# Patient Record
Sex: Male | Born: 1960
Health system: Southern US, Community
[De-identification: ages and names within clinical notes are randomized; demographics above are authoritative.]

## PROBLEM LIST (undated history)

## (undated) DIAGNOSIS — I2510 Atherosclerotic heart disease of native coronary artery without angina pectoris: Secondary | ICD-10-CM

## (undated) DIAGNOSIS — S46212A Strain of muscle, fascia and tendon of other parts of biceps, left arm, initial encounter: Secondary | ICD-10-CM

## (undated) DIAGNOSIS — E119 Type 2 diabetes mellitus without complications: Secondary | ICD-10-CM

## (undated) DIAGNOSIS — E669 Obesity, unspecified: Secondary | ICD-10-CM

## (undated) DIAGNOSIS — D649 Anemia, unspecified: Secondary | ICD-10-CM

## (undated) DIAGNOSIS — I10 Essential (primary) hypertension: Secondary | ICD-10-CM

## (undated) DIAGNOSIS — G4730 Sleep apnea, unspecified: Secondary | ICD-10-CM

## (undated) DIAGNOSIS — R7302 Impaired glucose tolerance (oral): Secondary | ICD-10-CM

## (undated) DIAGNOSIS — R7303 Prediabetes: Secondary | ICD-10-CM

## (undated) DIAGNOSIS — Z87442 Personal history of urinary calculi: Secondary | ICD-10-CM

## (undated) DIAGNOSIS — E785 Hyperlipidemia, unspecified: Secondary | ICD-10-CM

## (undated) DIAGNOSIS — Z9889 Other specified postprocedural states: Secondary | ICD-10-CM

## (undated) HISTORY — DX: Essential (primary) hypertension: I10

## (undated) HISTORY — DX: Prediabetes: R73.03

## (undated) HISTORY — DX: Atherosclerotic heart disease of native coronary artery without angina pectoris: I25.10

## (undated) HISTORY — DX: Type 2 diabetes mellitus without complications: E11.9

## (undated) HISTORY — DX: Impaired glucose tolerance (oral): R73.02

## (undated) HISTORY — PX: BACK SURGERY: SHX140

## (undated) HISTORY — DX: Other specified postprocedural states: Z98.890

## (undated) HISTORY — PX: EYE SURGERY: SHX253

## (undated) HISTORY — PX: HEMORRHOID SURGERY: SHX153

## (undated) HISTORY — DX: Hyperlipidemia, unspecified: E78.5

## (undated) HISTORY — DX: Obesity, unspecified: E66.9

---

## 1998-03-03 ENCOUNTER — Ambulatory Visit: Admission: RE | Admit: 1998-03-03 | Discharge: 1998-03-03 | Payer: Self-pay | Admitting: Otolaryngology

## 1998-11-29 ENCOUNTER — Ambulatory Visit (HOSPITAL_COMMUNITY): Admission: RE | Admit: 1998-11-29 | Discharge: 1998-11-29 | Payer: Self-pay | Admitting: *Deleted

## 2005-04-25 ENCOUNTER — Emergency Department (HOSPITAL_COMMUNITY): Admission: EM | Admit: 2005-04-25 | Discharge: 2005-04-26 | Payer: Self-pay | Admitting: Emergency Medicine

## 2010-06-28 ENCOUNTER — Other Ambulatory Visit: Payer: Self-pay | Admitting: Cardiology

## 2010-06-28 ENCOUNTER — Inpatient Hospital Stay (HOSPITAL_COMMUNITY)
Admission: EM | Admit: 2010-06-28 | Discharge: 2010-06-30 | Payer: Self-pay | Source: Home / Self Care | Attending: Cardiovascular Disease | Admitting: Cardiovascular Disease

## 2010-06-28 LAB — COMPREHENSIVE METABOLIC PANEL
ALT: 37 U/L (ref 0–53)
AST: 34 U/L (ref 0–37)
Albumin: 3.6 g/dL (ref 3.5–5.2)
Alkaline Phosphatase: 102 U/L (ref 39–117)
BUN: 10 mg/dL (ref 6–23)
CO2: 28 mEq/L (ref 19–32)
Calcium: 9.7 mg/dL (ref 8.4–10.5)
Chloride: 102 mEq/L (ref 96–112)
Creatinine, Ser: 1.03 mg/dL (ref 0.4–1.5)
GFR calc Af Amer: >60 mL/min (ref >60)
GFR calc non Af Amer: >60 mL/min (ref >60)
Glucose, Bld: 119 mg/dL — ABNORMAL HIGH (ref 70–99)
Potassium: 4.6 mEq/L (ref 3.5–5.1)
Sodium: 137 mEq/L (ref 135–145)
Total Bilirubin: 0.9 mg/dL (ref 0.3–1.2)
Total Protein: 7.2 g/dL (ref 6.0–8.3)

## 2010-06-28 LAB — CBC
HCT: 43.6 % (ref 39.0–52.0)
Hemoglobin: 14.4 g/dL (ref 13.0–17.0)
MCH: 29.1 pg (ref 26.0–34.0)
MCHC: 33 g/dL (ref 30.0–36.0)
MCV: 88.1 fL (ref 78.0–100.0)
Platelets: 335 10*3/uL (ref 150–400)
RBC: 4.95 MIL/uL (ref 4.22–5.81)
RDW: 13 % (ref 11.5–15.5)
WBC: 8 10*3/uL (ref 4.0–10.5)

## 2010-06-28 LAB — PLATELET INHIBITION P2Y12
P2Y12 % Inhibition: 0 %
Platelet Function  P2Y12: 226 [PRU] (ref 194–418)
Platelet Function Baseline: 209 [PRU] (ref 194–418)

## 2010-06-28 LAB — CK TOTAL AND CKMB (NOT AT ARMC)
CK, MB: 4.8 ng/mL — ABNORMAL HIGH (ref 0.3–4.0)
Relative Index: 2.6 — ABNORMAL HIGH (ref 0.0–2.5)
Total CK: 188 U/L (ref 7–232)

## 2010-06-28 LAB — DIFFERENTIAL
Basophils Absolute: 0.1 10*3/uL (ref 0.0–0.1)
Basophils Relative: 1 % (ref 0–1)
Eosinophils Absolute: 0.1 10*3/uL (ref 0.0–0.7)
Eosinophils Relative: 2 % (ref 0–5)
Lymphocytes Relative: 39 % (ref 12–46)
Lymphs Abs: 3.1 10*3/uL (ref 0.7–4.0)
Monocytes Absolute: 0.5 10*3/uL (ref 0.1–1.0)
Monocytes Relative: 6 % (ref 3–12)
Neutro Abs: 4.3 10*3/uL (ref 1.7–7.7)
Neutrophils Relative %: 53 % (ref 43–77)

## 2010-06-28 LAB — POCT CARDIAC MARKERS
CKMB, poc: 2.3 ng/mL (ref 1.0–8.0)
Myoglobin, poc: 92.1 ng/mL (ref 12–200)
Troponin i, poc: <0.05 ng/mL (ref 0.00–0.09)

## 2010-06-28 LAB — CARDIAC PANEL(CRET KIN+CKTOT+MB+TROPI)
CK, MB: 3.6 ng/mL (ref 0.3–4.0)
Relative Index: 2.2 (ref 0.0–2.5)
Total CK: 165 U/L (ref 7–232)
Troponin I: 0.05 ng/mL (ref 0.00–0.06)

## 2010-06-28 LAB — TROPONIN I: Troponin I: 0.04 ng/mL (ref 0.00–0.06)

## 2010-06-28 LAB — GLUCOSE, CAPILLARY: Glucose-Capillary: 171 mg/dL — ABNORMAL HIGH (ref 70–99)

## 2010-06-29 HISTORY — PX: CARDIAC CATHETERIZATION: SHX172

## 2010-06-29 HISTORY — PX: CORONARY ANGIOPLASTY WITH STENT PLACEMENT: SHX49

## 2010-06-29 LAB — GLUCOSE, CAPILLARY
Glucose-Capillary: 115 mg/dL — ABNORMAL HIGH (ref 70–99)
Glucose-Capillary: 99 mg/dL (ref 70–99)
Glucose-Capillary: 88 mg/dL (ref 70–99)

## 2010-06-29 LAB — CBC
HCT: 44.2 % (ref 39.0–52.0)
Hemoglobin: 14.4 g/dL (ref 13.0–17.0)
MCH: 29 pg (ref 26.0–34.0)
MCHC: 32.6 g/dL (ref 30.0–36.0)
MCV: 88.9 fL (ref 78.0–100.0)
Platelets: 308 10*3/uL (ref 150–400)
RBC: 4.97 MIL/uL (ref 4.22–5.81)
RDW: 13.1 % (ref 11.5–15.5)
WBC: 8.2 10*3/uL (ref 4.0–10.5)

## 2010-06-29 LAB — CARDIAC PANEL(CRET KIN+CKTOT+MB+TROPI)
CK, MB: 2.8 ng/mL (ref 0.3–4.0)
Relative Index: 2.3 (ref 0.0–2.5)
Total CK: 123 U/L (ref 7–232)
Troponin I: 0.03 ng/mL (ref 0.00–0.06)

## 2010-06-29 LAB — BASIC METABOLIC PANEL
BUN: 12 mg/dL (ref 6–23)
CO2: 30 mEq/L (ref 19–32)
Calcium: 9.9 mg/dL (ref 8.4–10.5)
Chloride: 105 mEq/L (ref 96–112)
Creatinine, Ser: 1.01 mg/dL (ref 0.4–1.5)
GFR calc Af Amer: >60 mL/min (ref >60)
GFR calc non Af Amer: >60 mL/min (ref >60)
Glucose, Bld: 120 mg/dL — ABNORMAL HIGH (ref 70–99)
Potassium: 4.1 mEq/L (ref 3.5–5.1)
Sodium: 142 mEq/L (ref 135–145)

## 2010-06-29 LAB — LIPID PANEL
Cholesterol: 215 mg/dL — ABNORMAL HIGH (ref 0–200)
HDL: 31 mg/dL — ABNORMAL LOW (ref >39)
LDL Cholesterol: 129 mg/dL — ABNORMAL HIGH (ref 0–99)
Total CHOL/HDL Ratio: 6.9 RATIO
Triglycerides: 273 mg/dL — ABNORMAL HIGH (ref <150)
VLDL: 55 mg/dL — ABNORMAL HIGH (ref 0–40)

## 2010-06-29 LAB — HEMOGLOBIN A1C
Hgb A1c MFr Bld: 6.2 % — ABNORMAL HIGH (ref <5.7)
Mean Plasma Glucose: 131 mg/dL — ABNORMAL HIGH (ref <117)

## 2010-06-29 LAB — PROTIME-INR
INR: 0.91 (ref 0.00–1.49)
Prothrombin Time: 12.5 seconds (ref 11.6–15.2)

## 2010-06-29 LAB — APTT: aPTT: 31 seconds (ref 24–37)

## 2010-06-30 LAB — BASIC METABOLIC PANEL
BUN: 10 mg/dL (ref 6–23)
CO2: 26 mEq/L (ref 19–32)
Calcium: 9.2 mg/dL (ref 8.4–10.5)
Chloride: 104 mEq/L (ref 96–112)
Creatinine, Ser: 1 mg/dL (ref 0.4–1.5)
GFR calc Af Amer: >60 mL/min (ref >60)
GFR calc non Af Amer: >60 mL/min (ref >60)
Glucose, Bld: 101 mg/dL — ABNORMAL HIGH (ref 70–99)
Potassium: 4.2 mEq/L (ref 3.5–5.1)
Sodium: 138 mEq/L (ref 135–145)

## 2010-06-30 LAB — CBC
HCT: 40.5 % (ref 39.0–52.0)
Hemoglobin: 13.3 g/dL (ref 13.0–17.0)
MCH: 28.9 pg (ref 26.0–34.0)
MCHC: 32.8 g/dL (ref 30.0–36.0)
MCV: 88 fL (ref 78.0–100.0)
Platelets: 278 10*3/uL (ref 150–400)
RBC: 4.6 MIL/uL (ref 4.22–5.81)
RDW: 13 % (ref 11.5–15.5)
WBC: 7.5 10*3/uL (ref 4.0–10.5)

## 2010-06-30 LAB — GLUCOSE, CAPILLARY
Glucose-Capillary: 120 mg/dL — ABNORMAL HIGH (ref 70–99)
Glucose-Capillary: 90 mg/dL (ref 70–99)

## 2010-07-14 ENCOUNTER — Ambulatory Visit: Admit: 2010-07-14 | Payer: Self-pay | Admitting: Physician Assistant

## 2010-07-14 ENCOUNTER — Encounter: Payer: Self-pay | Admitting: Physician Assistant

## 2010-07-14 ENCOUNTER — Ambulatory Visit
Admission: RE | Admit: 2010-07-14 | Discharge: 2010-07-14 | Payer: Self-pay | Source: Home / Self Care | Attending: Physician Assistant | Admitting: Physician Assistant

## 2010-07-14 DIAGNOSIS — I2510 Atherosclerotic heart disease of native coronary artery without angina pectoris: Secondary | ICD-10-CM | POA: Insufficient documentation

## 2010-07-14 DIAGNOSIS — E785 Hyperlipidemia, unspecified: Secondary | ICD-10-CM | POA: Insufficient documentation

## 2010-07-14 DIAGNOSIS — R011 Cardiac murmur, unspecified: Secondary | ICD-10-CM | POA: Insufficient documentation

## 2010-07-14 DIAGNOSIS — I10 Essential (primary) hypertension: Secondary | ICD-10-CM | POA: Insufficient documentation

## 2010-07-14 DIAGNOSIS — K649 Unspecified hemorrhoids: Secondary | ICD-10-CM | POA: Insufficient documentation

## 2010-07-14 DIAGNOSIS — E739 Lactose intolerance, unspecified: Secondary | ICD-10-CM | POA: Insufficient documentation

## 2010-07-17 NOTE — Discharge Summary (Signed)
Micheal Chavez, Micheal Chavez               ACCOUNT NO.:  [id number]  MEDICAL RECORD NO.:  [id number]          PATIENT TYPE:  INP  LOCATION:  6526                         FACILITY:  [facility]  PHYSICIAN:  Noralyn Pick. Eden Emms, MD, FACCDATE OF BIRTH:  11-Jul-1960  DATE OF ADMISSION:  06/28/2010 DATE OF DISCHARGE:  06/30/2010                              DISCHARGE SUMMARY   PRIMARY CARDIOLOGIST:  Theron Arista C. Eden Emms, MD, [facility]  PRIMARY CARE PROVIDER:  Loraine Leriche A. Perini, MD.  DISCHARGE DIAGNOSIS:  Unstable angina.  SECONDARY DIAGNOSES: 1. Coronary artery disease status post successful drug-eluting stent     placement within the proximal right coronary artery, this     admission. 2. Hypertension. 3. Hyperlipidemia. 4. Borderline diabetes mellitus. 5. Obesity. 6. Status post back surgery. 7. History of cataract status post surgical correction. 8. Status post hemorrhoid surgery x5.  ALLERGIES:  No known drug allergies.  PROCEDURE:  Left heart cardiac catheterization revealing 95% proximal stenosis in the right coronary artery.  He otherwise had scattered irregularities and nonobstructive disease throughout the LAD and left circumflex.  LV function was normal.  PROCEDURE:  Successful PCI and stenting of the proximal right coronary artery with placement of a 3.5 x 20-mm Promus Element Plus drug-eluting stent.  HISTORY OF PRESENT ILLNESS:  A 50 year old male, without prior cardiac history, who over the past several months has been experiencing weekly exertional chest discomfort while taking walks, and then, approximately 5 days prior to admission, while renovating a house, noted substernal chest discomfort radiating to his neck, jaw, shoulder and arm.  The patient continued to have intermittent exertional symptoms over the subsequent days and saw Dr. Waynard Edwards as an add-on in the office on January 4 and was promptly advised to present to the ER for admission.  In the ED, patient was pain free and ECG  showed no acute changes.  He was admitted for further evaluation.  HOSPITAL COURSE:  The patient ruled out for MI.  Symptoms were concerning for angina, as a result he was set up for left heart cardiac catheterization which took place on January 5, revealing 95% proximal stenosis and a large dominant right coronary artery.  He otherwise had nonobstructive disease with normal LV function.  Of note, patient had initially been loaded with Plavix therapy at the time of admission; however, P2Y12 platelet function testing remained elevated at 226 with 0% inhibition.  Therefore, the patient was loaded with 60 mg of Effient and underwent successful PCI and stenting of the proximal right coronary artery with placement of a 3.5 x 20-mm Promus Element Plus drug-eluting stent.  The patient tolerated this procedure well, and post procedure, been ambulating without recurrent symptoms or limitations.  He has been seen by Cardiac Rehab, and outpatient referral has been made.  He will be discharged home today in good condition.  DISCHARGE LABS:  Hemoglobin 13.3, hematocrit 40.5, WBCs 7.5, platelets 278, and INR 0.91.  Sodium 138, potassium 4.2, chloride 104, CO2 of 26, BUN 10, creatinine 1.0, glucose 101.  Total bilirubin 0.9, alkaline phosphatase 102, AST 34, ALT 37, total protein 7.2, albumin 3.6, calcium 9.2.  Hemoglobin A1c  6.2.  CK 123, MB 2.8, troponin-I 0.03.  Total cholesterol 215, triglycerides 273, HDL 31, and LDL 129.  DISPOSITION:  The patient will be discharged home today in good condition.  FOLLOWUP APPOINTMENTS: 1. The patient will follow up with Tereso Newcomer, PA, [facility]     Cardiology on January 20 at 10:30 a.m. 2. He is asked to follow up with Dr. Waynard Edwards in 3-4 weeks.  DISCHARGE MEDICATIONS: 1. Aspirin 325 mg daily. 2. Lipitor 80 mg at bedtime. 3. Nitroglycerin 0.4 mg sublingual p.r.n. chest pain. 4. Prasugrel 10 mg daily. 5. Diovan 320 mg daily.  OUTSTANDING LABORATORY  STUDIES:  The patient will require followup lipids and LFTs in the next 6-8 weeks given new statin therapy.  Duration of discharge encounter, 45 minutes including physician time.     Nicolasa Ducking, ANP   ______________________________ Noralyn Pick. Eden Emms, MD, [facility]    CB/MEDQ  D:  06/30/2010  T:  06/30/2010  Job:  [id number]  cc:   Loraine Leriche A. Perini, M.D.  Electronically Signed by Nicolasa Ducking ANP on 07/17/2010 03:45:08 PM Electronically Signed by Charlton Haws MD [facility] on 07/17/2010 05:29:12 PM

## 2010-07-20 ENCOUNTER — Telehealth: Payer: Self-pay | Admitting: Cardiovascular Disease

## 2010-07-20 NOTE — Procedures (Addendum)
  NAMEGAITHER, Micheal Chavez               ACCOUNT NO.:  [id number]  MEDICAL RECORD NO.:  [id number]          PATIENT TYPE:  INP  LOCATION:  6526                         FACILITY:  [facility]  PHYSICIAN:  Colleen Can. Deborah Chalk, M.D.DATE OF BIRTH:  1960-07-19  DATE OF PROCEDURE:  06/29/2010 DATE OF DISCHARGE:                           CARDIAC CATHETERIZATION   PROCEDURE:  Left heart catheterization with selective coronary angiography and left ventricular angiography.  TYPE AND SITE OF ENTRY:  Percutaneous right femoral artery.  CATHETERS:  A 5-French 4 curved Judkins left and right coronary catheter, 5-French pigtail ventriculographic catheter.  CONTRAST MATERIAL:  Omnipaque.  MEDICATIONS GIVEN PRIOR TO PROCEDURE:  None.  MEDICATIONS GIVEN DURING THE PROCEDURE:  Fentanyl, Versed.  COMMENT:  The patient tolerated the procedure well.  HEMODYNAMIC DATA:  The aortic pressure was 156/98, LV is 137/4-50.  ANGIOGRAPHIC DATA:  Left ventricular angiogram was performed in RAO position.  Overall cardiac size and silhouette are normal.  Global ejection fraction is estimated to be at 60-65%.  CORONARY ARTERIES:  Coronary arteries arise and distribute normally.  It is a right-dominant system. 1. Right coronary artery.  The right coronary artery has a complex     somewhat eccentric plaque approximately that would be 95% stenotic.     It occurs approximately 3-4 cm from the ostium and it involves     approximately 2-3 cm in length.  There are 2 large distal branches     of the right coronary artery without significant focal obstructive     disease. 2. Left main coronary artery is normal. 3. Left anterior descending is a moderate-sized vessel with a large     proximal diagonal.  There are 30% narrowings in the left anterior     descending without significant obstructive disease. 4. The left circumflex is a moderately large vessel.  There is a 30-     50% narrowing in the mid circumflex obtuse marginal  branch.  It     does not appear to be significant or obstructive in nature.  OVERALL IMPRESSION: 1. Severe stenosis in the proximal dominant right coronary artery. 2. Normal left ventricular function. 3. Mild-to-moderate scattered atherosclerosis in the left coronary     system.  DISCUSSION:  Mr. Oberhaus will be referred for PCI today.     Colleen Can. Deborah Chalk, M.D.     SNT/MEDQ  D:  06/29/2010  T:  06/30/2010  Job:  [id number]  cc:   Loraine Leriche A. Perini, M.D.  Electronically Signed by Roger Shelter M.D. on 07/20/2010 03:35:43 PM

## 2010-07-27 NOTE — Progress Notes (Signed)
Summary: pt needs clearence  Phone Note From Other Clinic Call back at [phone]   Caller: Bjorn Loser Ref coor. with Dr. Kinnie Scales Request: Talk with Nurse, Talk with Provider Summary of Call: pt wants to have hemmeroid banding done but since he had stents in not long ago they want a note ststing it is ok for the patient to have this done pls fax to [phone] Initial call taken by: Omer Jack,  July 20, 2010 9:34 AM  Follow-up for Phone Call        spoke with rhonda, pt will not be able to stop effient for one year after a drug eluting stent is placed Deliah Goody, RN  July 20, 2010 11:07 AM

## 2010-07-27 NOTE — Assessment & Plan Note (Signed)
Summary: eph/chest pain/bcbs/mj   Visit Type:  Initial Consult Primary Provider:  Rodrigo Ran, MD   History of Present Illness: Primary Cardiologist:  Dr. Charlton Haws  Micheal Chavez is a 50 yo male with hypertension, hyperlipidemia and borderline diabetes mellitus who presented to [facility] on January 4 with chest discomfort consistent with unstable angina pectoris.  He ruled out for myocardial infarction.  Cardiac catheterization performed in the 06/29/10 demonstrated a 95% lesion in the RCA which was successfully treated with a drug-eluting stent.  His P2Y12 was 0% on Plavix and he was switched Effient.  Echocardiogram on 1/4 demonstrated an ejection fraction of 60-65%, mild LVH and mild left atrial enlargement.  He returns for followup today.  He denies chest discomfort.  He denies shortness of breath.  He denies syncope or lower extremity edema.  He is tolerating his medications well.  He had several questions today about his diagnosis of coronary disease and reasons for his medications.  I answered all his questions today.  Current Medications (verified): 1)  Diovan 320 Mg Tabs (Valsartan) .... Take One Tablet By Mouth Daily 2)  Lipitor 80 Mg Tabs (Atorvastatin Calcium) .... Take One Tablet By Mouth Daily. 3)  Effient 10 Mg Tabs (Prasugrel Hcl) .... Take One Tablet By Mouth Daily 4)  Aspirin 325 Mg Tabs (Aspirin) .... Once Daily  Allergies (verified): No Known Drug Allergies  Past History:  Past Medical History: CAD   a. s/p Promus DES to RCA 06/29/2010   b. P2Y12 0% . . . placed on Effient   c. cath 06/29/10: LAD 30%; Cfx 30-50%; EF 60-65% Echo 06/28/10: EF 60-65%; mild LVH; mild LAE Hypertension.  Borderline diabetes mellitus.  Dyslipidemia.  Obesity.   Review of Systems       He notes some bleeding hemorrhoids recently.  Otherwise, as per  the HPI.  All other systems reviewed and negative.   Vital Signs:  Patient profile:   51 year old male Height:      71  inches Weight:      272 pounds BMI:     38.07 Pulse rate:   70 / minute BP sitting:   120 / 70  (left arm)  Vitals Entered By: Laurance Flatten CMA (July 14, 2010 11:18 AM)  Physical Exam  General:  Well nourished, well developed, in no acute distress HEENT: normal Neck: no JVD Cardiac:  normal S1, S2; RRR; no murmur Lungs:  clear to auscultation bilaterally, no wheezing, rhonchi or rales Abd: soft, nontender, no hepatomegaly Ext: no edema: RFA site without hematoma or bruit Vascular: no carotid  bruits Skin: warm and dry Neuro:  CNs 2-12 intact, no focal abnormalities noted    EKG  Procedure date:  07/14/2010  Findings:      Normal sinus rhythm Heart rate 70 Normal axis LVH No ischemic changes.  Impression & Recommendations:  Problem # 1:  CORONARY ARTERY DISEASE (ICD-414.00) Status post drug-eluting stent to the RCA 06/29/10.  His platelet inhibition was too low on Plavix and will remain on Effient as well as aspirin for a minimum of one year. He is doing well without angina.  He prefers to do cardiac rehabilitation on his own.  I discussed with him proper ways to increase activity.  I also recommended 5-10 pounds of weight loss.  Problem # 2:  HYPERLIPIDEMIA (ICD-272.4) He is now on high-dose Lipitor.  He will need followup lipids and LFTs in 6-8 weeks.  Problem # 3:  HYPERTENSION (ICD-401.9)  Controlled.  Problem # 4:  GLUCOSE INTOLERANCE (ICD-271.3) Follow up with primary care.  Problem # 5:  HEMORRHOIDS (ICD-455.6) He has been followed by Dr. Kinnie Scales and has a history of bleeding.  I have asked him to followup with gastroenterology.  He knows the importance of continuing on aspirin and Effient.  Other Orders: EKG w/ Interpretation (93000)  Patient Instructions: 1)  Your physician recommends that you schedule a follow-up appointment in: 3 months with Dr. Eden Emms. 2)  Your physician recommends that you return for lab work in: 6-8 weeks for  Liver and lipid  panel.  3)  AS PER SCOTT WEAVER, PA: DO NOT STOP YOU EFFIENT UNTIL ONE OF OUR CARDIOLOGIST HERE AT [place] TELLS YOU IT IS OK TO STOP EFFIENT.   Appended Document: [facility]     Allergies: No Known Drug Allergies   Other Orders: EKG w/ Interpretation (93000)

## 2010-09-08 ENCOUNTER — Other Ambulatory Visit (INDEPENDENT_AMBULATORY_CARE_PROVIDER_SITE_OTHER): Payer: BC Managed Care – PPO

## 2010-09-08 ENCOUNTER — Encounter: Payer: Self-pay | Admitting: Cardiovascular Disease

## 2010-09-08 ENCOUNTER — Other Ambulatory Visit: Payer: Self-pay | Admitting: Cardiovascular Disease

## 2010-09-08 DIAGNOSIS — I2510 Atherosclerotic heart disease of native coronary artery without angina pectoris: Secondary | ICD-10-CM

## 2010-09-08 DIAGNOSIS — E785 Hyperlipidemia, unspecified: Secondary | ICD-10-CM

## 2010-09-08 LAB — HEPATIC FUNCTION PANEL
ALT: 73 U/L — ABNORMAL HIGH (ref 0–53)
AST: 38 U/L — ABNORMAL HIGH (ref 0–37)
Albumin: 4.2 g/dL (ref 3.5–5.2)
Alkaline Phosphatase: 140 U/L — ABNORMAL HIGH (ref 39–117)
Bilirubin, Direct: 0.1 mg/dL (ref 0.0–0.3)
Total Bilirubin: 0.6 mg/dL (ref 0.3–1.2)
Total Protein: 6.8 g/dL (ref 6.0–8.3)

## 2010-09-08 LAB — LIPID PANEL
Cholesterol: 116 mg/dL (ref 0–200)
HDL: 31.9 mg/dL — ABNORMAL LOW (ref >39.00)
LDL Cholesterol: 70 mg/dL (ref 0–99)
Total CHOL/HDL Ratio: 4
Triglycerides: 70 mg/dL (ref 0.0–149.0)
VLDL: 14 mg/dL (ref 0.0–40.0)

## 2010-09-26 ENCOUNTER — Telehealth: Payer: Self-pay | Admitting: Physician Assistant

## 2010-09-26 NOTE — Telephone Encounter (Signed)
Labs faxed over to Anita/Dr.Perini @ [facility] @ [phone] 09/26/10/KM

## 2011-01-31 ENCOUNTER — Telehealth: Payer: Self-pay | Admitting: Cardiovascular Disease

## 2011-01-31 ENCOUNTER — Emergency Department (HOSPITAL_COMMUNITY): Payer: BC Managed Care – PPO

## 2011-01-31 ENCOUNTER — Emergency Department (HOSPITAL_COMMUNITY)
Admission: EM | Admit: 2011-01-31 | Discharge: 2011-01-31 | Disposition: A | Discharge status: Home / Self Care | Payer: BC Managed Care – PPO | Attending: Emergency Medicine | Admitting: Emergency Medicine

## 2011-01-31 DIAGNOSIS — M545 Low back pain, unspecified: Secondary | ICD-10-CM | POA: Insufficient documentation

## 2011-01-31 DIAGNOSIS — E669 Obesity, unspecified: Secondary | ICD-10-CM | POA: Insufficient documentation

## 2011-01-31 DIAGNOSIS — I10 Essential (primary) hypertension: Secondary | ICD-10-CM | POA: Insufficient documentation

## 2011-01-31 DIAGNOSIS — R339 Retention of urine, unspecified: Secondary | ICD-10-CM | POA: Insufficient documentation

## 2011-01-31 DIAGNOSIS — N200 Calculus of kidney: Secondary | ICD-10-CM | POA: Insufficient documentation

## 2011-01-31 DIAGNOSIS — R112 Nausea with vomiting, unspecified: Secondary | ICD-10-CM | POA: Insufficient documentation

## 2011-01-31 DIAGNOSIS — Z7982 Long term (current) use of aspirin: Secondary | ICD-10-CM | POA: Insufficient documentation

## 2011-01-31 DIAGNOSIS — R10814 Left lower quadrant abdominal tenderness: Secondary | ICD-10-CM | POA: Insufficient documentation

## 2011-01-31 DIAGNOSIS — Z79899 Other long term (current) drug therapy: Secondary | ICD-10-CM | POA: Insufficient documentation

## 2011-01-31 DIAGNOSIS — R109 Unspecified abdominal pain: Secondary | ICD-10-CM | POA: Insufficient documentation

## 2011-01-31 LAB — POCT I-STAT, CHEM 8
Creatinine, Ser: 1.1 mg/dL (ref 0.50–1.35)
Glucose, Bld: 157 mg/dL — ABNORMAL HIGH (ref 70–99)
Hemoglobin: 13.9 g/dL (ref 13.0–17.0)
Potassium: 4.3 mEq/L (ref 3.5–5.1)
Sodium: 139 mEq/L (ref 135–145)
TCO2: 23 mmol/L (ref 0–100)

## 2011-01-31 LAB — URINALYSIS, ROUTINE W REFLEX MICROSCOPIC
Bilirubin Urine: NEGATIVE
Glucose, UA: NEGATIVE mg/dL
Nitrite: NEGATIVE
Protein, ur: 30 mg/dL — AB
Specific Gravity, Urine: 1.02 (ref 1.005–1.030)
Urobilinogen, UA: 0.2 mg/dL (ref 0.0–1.0)
pH: 7 (ref 5.0–8.0)

## 2011-01-31 LAB — URINE MICROSCOPIC-ADD ON

## 2011-01-31 NOTE — Telephone Encounter (Signed)
Pt went to [place] today and he has a kidney stone and he was put on percocet 10mg  q4hr prn, flomax 0.4mg  qhs, Ibuprophen 600mg  tid and he wants to confirm he can take this and it wont interfer with his cardiac meds

## 2011-01-31 NOTE — Telephone Encounter (Signed)
I reviewed with Tereso Newcomer. OK to use Ibuprofen for 1 week.Lorin Picket said pt should not discontinue aspirin or Effient .I talked with pt.

## 2011-02-28 ENCOUNTER — Other Ambulatory Visit: Payer: Self-pay | Admitting: Nurse Practitioner

## 2011-03-01 ENCOUNTER — Encounter: Payer: Self-pay | Admitting: Cardiovascular Disease

## 2011-07-03 ENCOUNTER — Encounter: Payer: Self-pay | Admitting: Cardiovascular Disease

## 2011-07-10 ENCOUNTER — Telehealth: Payer: Self-pay | Admitting: Cardiovascular Disease

## 2011-07-10 NOTE — Telephone Encounter (Signed)
Spoke with pt has appt next week with Dr Eden Emms  is running low on effient does not wish to get  30 days worth of med   if MD stops  Effient samples left at front desk number 14 ./cy

## 2011-07-10 NOTE — Telephone Encounter (Signed)
New Problem   Patient has medication concerns, please return call to patient on mobile @ [phone].

## 2011-07-17 ENCOUNTER — Encounter: Payer: Self-pay | Admitting: *Deleted

## 2011-07-19 ENCOUNTER — Ambulatory Visit: Payer: BC Managed Care – PPO | Admitting: Cardiovascular Disease

## 2011-07-20 ENCOUNTER — Encounter: Payer: Self-pay | Admitting: Cardiovascular Disease

## 2011-07-20 ENCOUNTER — Ambulatory Visit (INDEPENDENT_AMBULATORY_CARE_PROVIDER_SITE_OTHER): Payer: BC Managed Care – PPO | Admitting: Cardiovascular Disease

## 2011-07-20 DIAGNOSIS — E739 Lactose intolerance, unspecified: Secondary | ICD-10-CM

## 2011-07-20 DIAGNOSIS — I2510 Atherosclerotic heart disease of native coronary artery without angina pectoris: Secondary | ICD-10-CM

## 2011-07-20 DIAGNOSIS — E785 Hyperlipidemia, unspecified: Secondary | ICD-10-CM

## 2011-07-20 DIAGNOSIS — I10 Essential (primary) hypertension: Secondary | ICD-10-CM

## 2011-07-20 MED ORDER — ATORVASTATIN CALCIUM 80 MG PO TABS: 80.0000 mg | ORAL_TABLET | Freq: Every day | ORAL | Status: AC

## 2011-07-20 MED ORDER — VALSARTAN 320 MG PO TABS: 320.0000 mg | ORAL_TABLET | Freq: Every day | ORAL | Status: DC

## 2011-07-20 MED ORDER — PRASUGREL HCL 10 MG PO TABS: 10.0000 mg | ORAL_TABLET | Freq: Every day | ORAL | Status: DC

## 2011-07-20 MED ORDER — NITROGLYCERIN 0.4 MG SL SUBL: 0.4000 mg | SUBLINGUAL_TABLET | SUBLINGUAL | Status: DC | PRN

## 2011-07-20 NOTE — Assessment & Plan Note (Signed)
Cholesterol is at goal.  Continue current dose of statin and diet Rx.  No myalgias or side effects.  F/U  LFT's in 6 months. Lab Results  Component Value Date   LDLCALC 70 09/08/2010

## 2011-07-20 NOTE — Assessment & Plan Note (Signed)
Well controlled.  Continue current medications and low sodium Dash type diet.    

## 2011-07-20 NOTE — Assessment & Plan Note (Signed)
Discussed diet.  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  [employer] A1c 6.5 or less

## 2011-07-20 NOTE — Progress Notes (Signed)
Micheal Chavez is a 51 yo male with hypertension, hyperlipidemia and borderline diabetes mellitus who presented to [facility] on January 4 with chest discomfort consistent with unstable angina pectoris. He ruled out for myocardial infarction. Cardiac catheterization performed in the 06/29/10 demonstrated a 95% lesion in the RCA which was successfully treated with a drug-eluting stent. His P2Y12 was 0% on Plavix and he was switched Effient. Echocardiogram on 1/4 demonstrated an ejection fraction of 60-65%, mild LVH and mild left atrial enlargement. He returns for followup today.   He has been having some SSCP.  More with up and down hills.  Pain eases with rest.  Does not happen all the time.  Sometimes can walk for 50 minutes with no pain.  Cannot tell if pain similar to angina prior to stent.  Mild exertional dyspnea   ROS: Denies fever, malais, weight loss, blurry vision, decreased visual acuity, cough, sputum, SOB, hemoptysis, pleuritic pain, palpitaitons, heartburn, abdominal pain, melena, lower extremity edema, claudication, or rash.  All other systems reviewed and negative  General: Affect appropriate Healthy:  appears stated age HEENT: normal Neck supple with no adenopathy JVP normal no bruits no thyromegaly Lungs clear with no wheezing and good diaphragmatic motion Heart:  S1/S2 no murmur, no rub, gallop or click PMI normal Abdomen: benighn, BS positve, no tenderness, no AAA no bruit.  No HSM or HJR Distal pulses intact with no bruits No edema Neuro non-focal Skin warm and dry No muscular weakness   Current Outpatient Prescriptions  Medication Sig Dispense Refill  . aspirin 325 MG tablet Take 325 mg by mouth daily.      Marland Kitchen atorvastatin (LIPITOR) 80 MG tablet Take 80 mg by mouth daily.      Marland Kitchen EFFIENT 10 MG TABS TAKE ONE TABLET BY MOUTH EVERY DAY  30 each  5  . nitroGLYCERIN (NITROSTAT) 0.4 MG SL tablet Place 0.4 mg under the tongue every 5 (five) minutes as needed.      .  valsartan (DIOVAN) 320 MG tablet Take 320 mg by mouth daily.        Allergies  Review of patient's allergies indicates no known allergies.  Electrocardiogram:  NSR rate 76  Normal ECG  Assessment and Plan

## 2011-07-20 NOTE — Patient Instructions (Signed)
Your physician has requested that you have en exercise stress myoview. For further information please visit [email or web address]. Please follow instruction sheet, as given.   

## 2011-07-20 NOTE — Assessment & Plan Note (Signed)
Cannot be sure symptoms are not angina.  Patient wants to stop Effient after one year post stent.  Offerred cath or myovue.  Patient had a bad experience with cath last time being postponed due to emergencies.  Prefers myovue to start which is reasonable since symptoms are not severe and are intermitant.  Nitro called in.  Continue ASA  Sample of Effient given

## 2011-07-27 ENCOUNTER — Telehealth: Payer: Self-pay | Admitting: *Deleted

## 2011-07-27 NOTE — Telephone Encounter (Signed)
LMTCB RECEIVED FAX FROM [facility] RE  PT'S INS NOT COVERING DIOVAN 320 MG ./CY

## 2011-07-31 ENCOUNTER — Ambulatory Visit (HOSPITAL_COMMUNITY): Payer: BC Managed Care – PPO | Attending: Cardiology | Admitting: Radiology

## 2011-07-31 VITALS — BP 147/89 | Ht 73.0 in | Wt 286.0 lb

## 2011-07-31 DIAGNOSIS — E785 Hyperlipidemia, unspecified: Secondary | ICD-10-CM | POA: Insufficient documentation

## 2011-07-31 DIAGNOSIS — R0602 Shortness of breath: Secondary | ICD-10-CM

## 2011-07-31 DIAGNOSIS — R0609 Other forms of dyspnea: Secondary | ICD-10-CM | POA: Insufficient documentation

## 2011-07-31 DIAGNOSIS — I2510 Atherosclerotic heart disease of native coronary artery without angina pectoris: Secondary | ICD-10-CM | POA: Insufficient documentation

## 2011-07-31 DIAGNOSIS — R42 Dizziness and giddiness: Secondary | ICD-10-CM | POA: Insufficient documentation

## 2011-07-31 DIAGNOSIS — Z8249 Family history of ischemic heart disease and other diseases of the circulatory system: Secondary | ICD-10-CM | POA: Insufficient documentation

## 2011-07-31 DIAGNOSIS — Z87891 Personal history of nicotine dependence: Secondary | ICD-10-CM | POA: Insufficient documentation

## 2011-07-31 DIAGNOSIS — R079 Chest pain, unspecified: Secondary | ICD-10-CM

## 2011-07-31 DIAGNOSIS — E119 Type 2 diabetes mellitus without complications: Secondary | ICD-10-CM | POA: Insufficient documentation

## 2011-07-31 DIAGNOSIS — R0989 Other specified symptoms and signs involving the circulatory and respiratory systems: Secondary | ICD-10-CM | POA: Insufficient documentation

## 2011-07-31 DIAGNOSIS — E669 Obesity, unspecified: Secondary | ICD-10-CM | POA: Insufficient documentation

## 2011-07-31 DIAGNOSIS — I10 Essential (primary) hypertension: Secondary | ICD-10-CM | POA: Insufficient documentation

## 2011-07-31 MED ORDER — TECHNETIUM TC 99M TETROFOSMIN IV KIT
33.0000 | PACK | Freq: Once | INTRAVENOUS | Status: AC | PRN
  Administered 2011-07-31: 33 via INTRAVENOUS

## 2011-07-31 MED ORDER — TECHNETIUM TC 99M TETROFOSMIN IV KIT
11.0000 | PACK | Freq: Once | INTRAVENOUS | Status: AC | PRN
  Administered 2011-07-31: 11 via INTRAVENOUS

## 2011-07-31 NOTE — Progress Notes (Signed)
[facility] SITE 3 NUCLEAR MED [address] [place] [address] [phone]  Cardiology Nuclear Med Study  Micheal Chavez is a 51 y.o. male [id number] Jun 21, 1961   Nuclear Med Background Indication for Stress Test:  Evaluation for Ischemia and Stent Patency History:  1/12 Echo:EF=60-65%, mild LVH; 1/12 Stent-RCA, EF=60-65% Cardiac Risk Factors: Family History - CAD, History of Smoking, Hypertension, Lipids, NIDDM and Obesity  Symptoms:  Chest Pain/"Discomfort" with and without Exertion (last episode of chest discomfort was yesterday), Dizziness and DOE   Nuclear Pre-Procedure Caffeine/Decaff Intake:  8:00pm NPO After: 8:00pm   Lungs:  Clear.  IV 0.9% NS with Angio Cath:  20g                                                                                                                                                             IV Site: R Hand IV Started by:  Cathlyn Parsons, RN  Chest Size (in):  52 Cup Size: n/a  Height: 6\' 1"  (1.854 m)  Weight:  286 lb (129.729 kg)  BMI:  Body mass index is 37.73 kg/(m^2). Tech Comments:  n/a    Nuclear Med Study 1 or 2 day study: 1 day  Stress Test Type:  Stress  Reading MD: Willa Rough, MD  Order Authorizing Provider:  Charlton Haws, MD  Resting Radionuclide: Technetium 78m Tetrofosmin  Resting Radionuclide Dose: 11.0 mCi   Stress Radionuclide:  Technetium 64m Tetrofosmin  Stress Radionuclide Dose: 33.0 mCi           Stress Protocol Rest HR: 61 Stress HR: 155  Rest BP: Sitting  147/89  Standing  153/94 Stress BP: 234/83  Exercise Time (min): 6:01 METS: 7.0   Predicted Max HR: 169 bpm % Max HR: 91.72 bpm Rate Pressure Product: 46962   Dose of Adenosine (mg):  n/a Dose of Lexiscan: n/a mg  Dose of Atropine (mg): n/a Dose of Dobutamine: n/a mcg/kg/min (at max HR)  Stress Test Technologist: Smiley Houseman, CMA-N  Nuclear Technologist:  Domenic Polite, CNMT     Rest Procedure:  Myocardial perfusion  imaging was performed at rest 45 minutes following the intravenous administration of Technetium 65m Tetrofosmin.  Rest ECG: Nonspecific ST-T wave changes, inferior-lateral Q-waves.  Stress Procedure:  The patient exercised for 6:01 on the treadmill utilizing the Bruce protocol.  The patient stopped due to fatigue and denied any chest pain.  There were ST-T wave changes and an abnormal blood pressure response.  BP dropped from 194/94 to 167/92 at peak exercise and was 234/83 immediately post exercise.  Technetium 43m Tetrofosmin was injected at peak exercise and myocardial perfusion imaging was performed after a brief delay.  Stress ECG: No significant change from baseline ECG  QPS Raw Data Images:  Patient motion noted; appropriate  software correction applied. Stress Images:  Normal homogeneous uptake in all areas of the myocardium. Rest Images:  Normal homogeneous uptake in all areas of the myocardium. Subtraction (SDS):  No evidence of ischemia. Transient Ischemic Dilatation (Normal <1.22):  0.95 Lung/Heart Ratio (Normal <0.45):  0.34  Quantitative Gated Spect Images QGS EDV:  128 ml QGS ESV:  51 ml QGS cine images:  Normal Wall Motion QGS EF: 60%  Impression Exercise Capacity:  Fair exercise capacity. BP Response:  Normal blood pressure response. Clinical Symptoms:  No chest pain. ECG Impression:  No significant ST segment change suggestive of ischemia. Comparison with Prior Nuclear Study: No previous nuclear study performed  Overall Impression:  Normal stress nuclear study.  Willa Rough, MD

## 2011-08-02 ENCOUNTER — Telehealth: Payer: Self-pay | Admitting: Cardiovascular Disease

## 2011-08-02 MED ORDER — VALSARTAN 320 MG PO TABS: 320.0000 mg | ORAL_TABLET | Freq: Every day | ORAL | Status: DC

## 2011-08-02 NOTE — Telephone Encounter (Signed)
Fu call Patient returning your call please call after 11am if possible

## 2011-08-02 NOTE — Telephone Encounter (Signed)
PT STATES HAS BEEN ON DIOVAN   WANTS SCRIPT SENT FOR  NUMBER 90 TO [facility] [address]  SCRIPT SENT VIA EPIC  AT PT'S REQUEST .Zack Seal

## 2011-08-02 NOTE — Telephone Encounter (Signed)
DIOVAN 320 MG NUMBER  90 SENT TO  [facility] AT PT'S REQUEST AND  AWARE OF MYOVIEW RESULTS./CY

## 2012-03-24 ENCOUNTER — Other Ambulatory Visit: Payer: Self-pay | Admitting: Cardiovascular Disease

## 2012-09-29 ENCOUNTER — Other Ambulatory Visit: Payer: Self-pay | Admitting: *Deleted

## 2012-09-29 MED ORDER — PRASUGREL HCL 10 MG PO TABS: ORAL_TABLET | ORAL | Status: DC

## 2012-10-23 ENCOUNTER — Ambulatory Visit (INDEPENDENT_AMBULATORY_CARE_PROVIDER_SITE_OTHER): Payer: BC Managed Care – PPO | Admitting: Cardiovascular Disease

## 2012-10-23 ENCOUNTER — Encounter: Payer: Self-pay | Admitting: Cardiovascular Disease

## 2012-10-23 VITALS — BP 153/83 | HR 70 | Wt 290.0 lb

## 2012-10-23 DIAGNOSIS — I10 Essential (primary) hypertension: Secondary | ICD-10-CM

## 2012-10-23 DIAGNOSIS — I2510 Atherosclerotic heart disease of native coronary artery without angina pectoris: Secondary | ICD-10-CM

## 2012-10-23 DIAGNOSIS — E785 Hyperlipidemia, unspecified: Secondary | ICD-10-CM

## 2012-10-23 NOTE — Patient Instructions (Addendum)
Your physician wants you to follow-up in: YEAR WITH DR Haywood Filler will receive a reminder letter in the mail two months in advance. If you don't receive a letter, please call our office to schedule the follow-up appointment. Your physician has recommended you make the following change in your medication:  STOP  EFFIENT

## 2012-10-23 NOTE — Progress Notes (Signed)
Patient ID: Micheal Chavez, male   DOB: [birth year], 52 y.o.   MRN: [id number] Micheal Chavez is a 52 yo male with hypertension, hyperlipidemia and borderline diabetes mellitus who presented to [facility] on January 4 with chest discomfort consistent with unstable angina pectoris. He ruled out for myocardial infarction. Cardiac catheterization performed in the 06/29/10 demonstrated a 95% lesion in the RCA which was successfully treated with a drug-eluting stent. His P2Y12 was 0% on Plavix and he was switched Effient. Echocardiogram on 1/4 demonstrated an ejection fraction of 60-65%, mild LVH and mild left atrial enlargement. He returns for followup today.   Normal myovue 2/13    Bleeding a lot when he cuts himself shaving Wants to stop Effient  ROS: Denies fever, malais, weight loss, blurry vision, decreased visual acuity, cough, sputum, SOB, hemoptysis, pleuritic pain, palpitaitons, heartburn, abdominal pain, melena, lower extremity edema, claudication, or rash.  All other systems reviewed and negative  General: Affect appropriate Healthy:  appears stated age HEENT: normal Neck supple with no adenopathy JVP normal no bruits no thyromegaly Lungs clear with no wheezing and good diaphragmatic motion Heart:  S1/S2 no murmur, no rub, gallop or click PMI normal Abdomen: benighn, BS positve, no tenderness, no AAA no bruit.  No HSM or HJR Distal pulses intact with no bruits No edema Neuro non-focal Skin warm and dry No muscular weakness   Current Outpatient Prescriptions  Medication Sig Dispense Refill  . aspirin 325 MG tablet Take 325 mg by mouth daily.      Marland Kitchen atorvastatin (LIPITOR) 80 MG tablet Take 1 tablet (80 mg total) by mouth daily.  30 tablet  6  . nitroGLYCERIN (NITROSTAT) 0.4 MG SL tablet Place 1 tablet (0.4 mg total) under the tongue every 5 (five) minutes as needed.  25 tablet  3  . prasugrel (EFFIENT) 10 MG TABS TAKE ONE TABLET BY MOUTH EVERY DAY  30 each  5  . valsartan  (DIOVAN) 320 MG tablet Take 1 tablet (320 mg total) by mouth daily.  90 tablet  3  . ANDROGEL PUMP 20.25 MG/ACT (1.62%) GEL As directed       No current facility-administered medications for this visit.    Allergies  Review of patient's allergies indicates no known allergies.  Electrocardiogram:  NSR rate 70 normal   Assessment and Plan

## 2012-10-23 NOTE — Assessment & Plan Note (Signed)
Cholesterol is at goal.  Continue current dose of statin and diet Rx.  No myalgias or side effects.  F/U  LFT's in 6 months. Lab Results  Component Value Date   LDLCALC 70 09/08/2010            Labs with Dr Marveen Reeks

## 2012-10-23 NOTE — Assessment & Plan Note (Signed)
Stable with no angina and good activity level.  Continue medical Rx Stop effient 2 years post stent to RCA

## 2012-10-23 NOTE — Assessment & Plan Note (Signed)
Well controlled.  Continue current medications and low sodium Dash type diet.    

## 2013-01-15 ENCOUNTER — Other Ambulatory Visit (HOSPITAL_COMMUNITY): Payer: Self-pay | Admitting: Internal Medicine

## 2013-01-15 ENCOUNTER — Ambulatory Visit (HOSPITAL_COMMUNITY)
Admission: RE | Admit: 2013-01-15 | Discharge: 2013-01-15 | Disposition: A | Discharge status: Home / Self Care | Payer: BC Managed Care – PPO | Source: Ambulatory Visit | Attending: Internal Medicine | Admitting: Internal Medicine

## 2013-01-15 DIAGNOSIS — M25561 Pain in right knee: Secondary | ICD-10-CM

## 2013-01-15 DIAGNOSIS — M7989 Other specified soft tissue disorders: Secondary | ICD-10-CM

## 2013-01-15 DIAGNOSIS — M79609 Pain in unspecified limb: Secondary | ICD-10-CM | POA: Insufficient documentation

## 2013-01-15 NOTE — Progress Notes (Signed)
VASCULAR LAB PRELIMINARY  PRELIMINARY  PRELIMINARY  PRELIMINARY  Right lower extremity venous Doppler completed.    Preliminary report:  There is no DVT, SVT or Baker's Cyst noted in the right lower extremity.  Jumana Paccione, RVT 01/15/2013, 5:40 PM

## 2013-08-31 ENCOUNTER — Telehealth: Payer: Self-pay | Admitting: Cardiovascular Disease

## 2013-08-31 NOTE — Telephone Encounter (Signed)
New message    Upcoming eye surgery 3/19 .  Patient on  325 mg ASA. Please advise when he need to stop prior to eye surgery

## 2013-09-01 NOTE — Telephone Encounter (Signed)
WILL FORWARD TO  DR NISHAN./CY 

## 2013-09-01 NOTE — Telephone Encounter (Signed)
LMTCB ./CY 

## 2013-09-01 NOTE — Telephone Encounter (Signed)
Stop asa 7 days before eye surgery or ask eye doctor what they want

## 2013-09-03 NOTE — Telephone Encounter (Signed)
New Prob   Pt calling regarding his aspirin. States he is planning to have eye surgery 3/19 and needs to come off Aspirin 1 week prior. Please call.

## 2013-09-03 NOTE — Telephone Encounter (Signed)
Called wanting to know if he could stop ASA 7 days prior to eye surgery.  Per Dr. Fabio BeringNishan's note 3/10 may stop 7 days prior to procedure.  Advised pt.

## 2014-09-06 ENCOUNTER — Other Ambulatory Visit (HOSPITAL_COMMUNITY): Payer: Self-pay | Admitting: Internal Medicine

## 2014-09-06 ENCOUNTER — Ambulatory Visit (HOSPITAL_COMMUNITY)
Admission: RE | Admit: 2014-09-06 | Discharge: 2014-09-06 | Disposition: A | Discharge status: Home / Self Care | Payer: BLUE CROSS/BLUE SHIELD | Source: Ambulatory Visit | Attending: Internal Medicine | Admitting: Internal Medicine

## 2014-09-06 DIAGNOSIS — R9089 Other abnormal findings on diagnostic imaging of central nervous system: Secondary | ICD-10-CM | POA: Insufficient documentation

## 2014-09-06 DIAGNOSIS — R29810 Facial weakness: Secondary | ICD-10-CM

## 2014-09-06 DIAGNOSIS — R202 Paresthesia of skin: Secondary | ICD-10-CM | POA: Insufficient documentation

## 2015-03-08 ENCOUNTER — Other Ambulatory Visit: Payer: Self-pay | Admitting: Orthopedic Surgery

## 2015-03-09 ENCOUNTER — Ambulatory Visit (HOSPITAL_BASED_OUTPATIENT_CLINIC_OR_DEPARTMENT_OTHER): Payer: BLUE CROSS/BLUE SHIELD | Admitting: Anesthesiology

## 2015-03-09 ENCOUNTER — Encounter (HOSPITAL_BASED_OUTPATIENT_CLINIC_OR_DEPARTMENT_OTHER)
Admission: RE | Disposition: A | Discharge status: Home / Self Care | Payer: Self-pay | Source: Ambulatory Visit | Attending: Orthopedic Surgery

## 2015-03-09 ENCOUNTER — Encounter (HOSPITAL_BASED_OUTPATIENT_CLINIC_OR_DEPARTMENT_OTHER): Payer: Self-pay | Admitting: *Deleted

## 2015-03-09 ENCOUNTER — Ambulatory Visit (HOSPITAL_BASED_OUTPATIENT_CLINIC_OR_DEPARTMENT_OTHER)
Admission: RE | Admit: 2015-03-09 | Discharge: 2015-03-09 | Disposition: A | Discharge status: Home / Self Care | Payer: BLUE CROSS/BLUE SHIELD | Source: Ambulatory Visit | Attending: Orthopedic Surgery | Admitting: Orthopedic Surgery

## 2015-03-09 DIAGNOSIS — S83241A Other tear of medial meniscus, current injury, right knee, initial encounter: Secondary | ICD-10-CM | POA: Insufficient documentation

## 2015-03-09 DIAGNOSIS — Z7902 Long term (current) use of antithrombotics/antiplatelets: Secondary | ICD-10-CM | POA: Insufficient documentation

## 2015-03-09 DIAGNOSIS — X58XXXA Exposure to other specified factors, initial encounter: Secondary | ICD-10-CM | POA: Diagnosis not present

## 2015-03-09 DIAGNOSIS — E785 Hyperlipidemia, unspecified: Secondary | ICD-10-CM | POA: Diagnosis not present

## 2015-03-09 DIAGNOSIS — Z79899 Other long term (current) drug therapy: Secondary | ICD-10-CM | POA: Insufficient documentation

## 2015-03-09 DIAGNOSIS — Z6841 Body Mass Index (BMI) 40.0 and over, adult: Secondary | ICD-10-CM | POA: Insufficient documentation

## 2015-03-09 DIAGNOSIS — R7309 Other abnormal glucose: Secondary | ICD-10-CM | POA: Insufficient documentation

## 2015-03-09 DIAGNOSIS — I2510 Atherosclerotic heart disease of native coronary artery without angina pectoris: Secondary | ICD-10-CM | POA: Insufficient documentation

## 2015-03-09 DIAGNOSIS — Z5309 Procedure and treatment not carried out because of other contraindication: Secondary | ICD-10-CM | POA: Insufficient documentation

## 2015-03-09 DIAGNOSIS — Z955 Presence of coronary angioplasty implant and graft: Secondary | ICD-10-CM | POA: Diagnosis not present

## 2015-03-09 DIAGNOSIS — Z87891 Personal history of nicotine dependence: Secondary | ICD-10-CM | POA: Diagnosis not present

## 2015-03-09 DIAGNOSIS — I10 Essential (primary) hypertension: Secondary | ICD-10-CM | POA: Diagnosis not present

## 2015-03-09 DIAGNOSIS — Z7982 Long term (current) use of aspirin: Secondary | ICD-10-CM | POA: Insufficient documentation

## 2015-03-09 LAB — POCT I-STAT, CHEM 8
BUN: 12 mg/dL (ref 6–20)
Calcium, Ion: 1.18 mmol/L (ref 1.12–1.23)
Chloride: 105 mmol/L (ref 101–111)
Creatinine, Ser: 1 mg/dL (ref 0.61–1.24)
GLUCOSE: 89 mg/dL (ref 65–99)
HEMATOCRIT: 37 % — AB (ref 39.0–52.0)
HEMOGLOBIN: 12.6 g/dL — AB (ref 13.0–17.0)
POTASSIUM: 3.7 mmol/L (ref 3.5–5.1)
Sodium: 139 mmol/L (ref 135–145)
TCO2: 22 mmol/L (ref 0–100)

## 2015-03-09 SURGERY — CANCELLED PROCEDURE
Anesthesia: General | Laterality: Right

## 2015-03-09 MED ORDER — FENTANYL CITRATE (PF) 100 MCG/2ML IJ SOLN: 50.0000 ug | INTRAMUSCULAR | Status: DC | PRN

## 2015-03-09 MED ORDER — CEFAZOLIN SODIUM-DEXTROSE 2-3 GM-% IV SOLR: 2.0000 g | INTRAVENOUS | Status: DC

## 2015-03-09 MED ORDER — PROPOFOL 10 MG/ML IV BOLUS
INTRAVENOUS | Status: AC
  Filled 2015-03-09: qty 20

## 2015-03-09 MED ORDER — MIDAZOLAM HCL 2 MG/2ML IJ SOLN
INTRAMUSCULAR | Status: AC
  Filled 2015-03-09: qty 4

## 2015-03-09 MED ORDER — DEXAMETHASONE SODIUM PHOSPHATE 10 MG/ML IJ SOLN
INTRAMUSCULAR | Status: AC
  Filled 2015-03-09: qty 1

## 2015-03-09 MED ORDER — MIDAZOLAM HCL 2 MG/2ML IJ SOLN: 1.0000 mg | INTRAMUSCULAR | Status: DC | PRN

## 2015-03-09 MED ORDER — ONDANSETRON HCL 4 MG/2ML IJ SOLN
INTRAMUSCULAR | Status: AC
  Filled 2015-03-09: qty 2

## 2015-03-09 MED ORDER — LIDOCAINE HCL (CARDIAC) 20 MG/ML IV SOLN
INTRAVENOUS | Status: AC
  Filled 2015-03-09: qty 5

## 2015-03-09 MED ORDER — CHLORHEXIDINE GLUCONATE 4 % EX LIQD: 60.0000 mL | Freq: Once | CUTANEOUS | Status: DC

## 2015-03-09 MED ORDER — FENTANYL CITRATE (PF) 100 MCG/2ML IJ SOLN
INTRAMUSCULAR | Status: AC
  Filled 2015-03-09: qty 4

## 2015-03-09 MED ORDER — GLYCOPYRROLATE 0.2 MG/ML IJ SOLN: 0.2000 mg | Freq: Once | INTRAMUSCULAR | Status: DC | PRN

## 2015-03-09 MED ORDER — SCOPOLAMINE 1 MG/3DAYS TD PT72: 1.0000 | MEDICATED_PATCH | Freq: Once | TRANSDERMAL | Status: DC | PRN

## 2015-03-09 MED ORDER — LACTATED RINGERS IV SOLN
INTRAVENOUS | Status: DC
  Administered 2015-03-09: 14:00:00 via INTRAVENOUS

## 2015-03-09 NOTE — H&P (Signed)
PREOPERATIVE H&P  Chief Complaint: r knee pain  HPI: Micheal Chavez is a 54 y.o. male who presents for evaluation of r knee pain. It has been present for 4 weeks and has been worsening. He has failed conservative measures including injection and activity modification. Pain is rated as moderate.  Past Medical History  Diagnosis Date  . Coronary artery disease     Status post drug-eluting stent to the RCA 06/29/10. --  cath 06/29/10: LAD 30%; Cfx 30-50%; EF 60-65%  . Hypertension   . Borderline diabetes mellitus   . Dyslipidemia   . Obesity   . Hyperlipidemia   . Glucose intolerance (impaired glucose tolerance)   . Hemorrhoids   . Previous back surgery    Past Surgical History  Procedure Laterality Date  . Hemorrhoid surgery      Status post hemorrhoid surgery x5  . Coronary angioplasty with stent placement  06/29/2010    Successful percutaneous transluminal coronary angioplasty  with placement of a drug-eluting stent in the proximal right coronary  artery  . Cardiac catheterization  06/29/2010    LAD 30%; Cfx 30-50%; EF 60-65%    Social History   Social History  . Marital Status: Married    Spouse Name: N/A  . Number of Children: N/A  . Years of Education: N/A   Social History Main Topics  . Smoking status: Former Games developer  . Smokeless tobacco: Not on file     Comment: quit 2004  . Alcohol Use: Not on file  . Drug Use: Not on file  . Sexual Activity: Not on file   Other Topics Concern  . Not on file   Social History Narrative  . No narrative on file   Family History  Problem Relation Age of Onset  . Heart attack Mother   . Heart attack Father    No Known Allergies Prior to Admission medications   Medication Sig Start Date End Date Taking? Authorizing Provider  ANDROGEL PUMP 20.25 MG/ACT (1.62%) GEL As directed 09/15/12   Historical Provider, MD  aspirin 325 MG tablet Take 325 mg by mouth daily.    Historical Provider, MD  atorvastatin (LIPITOR) 80 MG tablet  Take 1 tablet (80 mg total) by mouth daily. 07/20/11   Wendall Stade, MD  nitroGLYCERIN (NITROSTAT) 0.4 MG SL tablet Place 1 tablet (0.4 mg total) under the tongue every 5 (five) minutes as needed. 07/20/11   Wendall Stade, MD  prasugrel (EFFIENT) 10 MG TABS TAKE ONE TABLET BY MOUTH EVERY DAY 09/29/12   Wendall Stade, MD  valsartan (DIOVAN) 320 MG tablet Take 1 tablet (320 mg total) by mouth daily. 08/02/11   Wendall Stade, MD     Positive ROS: none  All other systems have been reviewed and were otherwise negative with the exception of those mentioned in the HPI and as above.  Physical Exam: There were no vitals filed for this visit.  General: Alert, no acute distress Cardiovascular: No pedal edema Respiratory: No cyanosis, no use of accessory musculature GI: No organomegaly, abdomen is soft and non-tender Skin: No lesions in the area of chief complaint Neurologic: Sensation intact distally Psychiatric: Patient is competent for consent with normal mood and affect Lymphatic: No axillary or cervical lymphadenopathy  MUSCULOSKELETAL: r knee +TTP over med side +mcmurray -instability  Assessment/Plan: medial meniscus tear right knee Plan for Procedure(s): RIGHT KNEE ARTHROSCOPY   The risks benefits and alternatives were discussed with the patient including but not limited to  the risks of nonoperative treatment, versus surgical intervention including infection, bleeding, nerve injury, malunion, nonunion, hardware prominence, hardware failure, need for hardware removal, blood clots, cardiopulmonary complications, morbidity, mortality, among others, and they were willing to proceed.  Predicted outcome is good, although there will be at least a six to nine month expected recovery.  [name], MD 03/09/2015 2:06 PM

## 2015-03-09 NOTE — Anesthesia Preprocedure Evaluation (Addendum)
Anesthesia Evaluation  Patient identified by MRN, date of birth, ID band Patient awake    Reviewed: Allergy & Precautions, NPO status , Patient's Chart, lab work & pertinent test results  Airway Mallampati: II  TM Distance: <3 FB Neck ROM: Full    Dental no notable dental hx.    Pulmonary neg pulmonary ROS, former smoker,    Pulmonary exam normal breath sounds clear to auscultation       Cardiovascular hypertension, Pt. on medications + CAD and + Cardiac Stents  Normal cardiovascular exam Rhythm:Regular Rate:Normal     Neuro/Psych negative neurological ROS  negative psych ROS   GI/Hepatic negative GI ROS, Neg liver ROS,   Endo/Other  Morbid obesity  Renal/GU negative Renal ROS  negative genitourinary   Musculoskeletal negative musculoskeletal ROS (+)   Abdominal   Peds negative pediatric ROS (+)  Hematology negative hematology ROS (+)   Anesthesia Other Findings   Reproductive/Obstetrics negative OB ROS                            Anesthesia Physical Anesthesia Plan  ASA: III  Anesthesia Plan: General   Post-op Pain Management:    Induction: Intravenous  Airway Management Planned: LMA  Additional Equipment:   Intra-op Plan:   Post-operative Plan: Extubation in OR  Informed Consent: I have reviewed the patients History and Physical, chart, labs and discussed the procedure including the risks, benefits and alternatives for the proposed anesthesia with the patient or authorized representative who has indicated his/her understanding and acceptance.   Dental advisory given  Plan Discussed with: CRNA and Surgeon  Anesthesia Plan Comments: (Patient ate egg burrito at 0930. Case cancelled)       Anesthesia Quick Evaluation

## 2015-03-10 ENCOUNTER — Other Ambulatory Visit: Payer: Self-pay | Admitting: Orthopedic Surgery

## 2015-03-10 ENCOUNTER — Encounter (HOSPITAL_BASED_OUTPATIENT_CLINIC_OR_DEPARTMENT_OTHER): Payer: Self-pay | Admitting: *Deleted

## 2015-03-14 ENCOUNTER — Ambulatory Visit (HOSPITAL_BASED_OUTPATIENT_CLINIC_OR_DEPARTMENT_OTHER)
Admission: RE | Admit: 2015-03-14 | Discharge: 2015-03-14 | Disposition: A | Discharge status: Home / Self Care | Payer: BLUE CROSS/BLUE SHIELD | Source: Ambulatory Visit | Attending: Orthopedic Surgery | Admitting: Orthopedic Surgery

## 2015-03-14 ENCOUNTER — Ambulatory Visit (HOSPITAL_BASED_OUTPATIENT_CLINIC_OR_DEPARTMENT_OTHER): Payer: BLUE CROSS/BLUE SHIELD | Admitting: Anesthesiology

## 2015-03-14 ENCOUNTER — Encounter (HOSPITAL_BASED_OUTPATIENT_CLINIC_OR_DEPARTMENT_OTHER): Payer: Self-pay | Admitting: Anesthesiology

## 2015-03-14 ENCOUNTER — Encounter (HOSPITAL_BASED_OUTPATIENT_CLINIC_OR_DEPARTMENT_OTHER)
Admission: RE | Disposition: A | Discharge status: Home / Self Care | Payer: Self-pay | Source: Ambulatory Visit | Attending: Orthopedic Surgery

## 2015-03-14 DIAGNOSIS — G4730 Sleep apnea, unspecified: Secondary | ICD-10-CM | POA: Insufficient documentation

## 2015-03-14 DIAGNOSIS — Z7982 Long term (current) use of aspirin: Secondary | ICD-10-CM | POA: Diagnosis not present

## 2015-03-14 DIAGNOSIS — M6751 Plica syndrome, right knee: Secondary | ICD-10-CM | POA: Insufficient documentation

## 2015-03-14 DIAGNOSIS — Z6841 Body Mass Index (BMI) 40.0 and over, adult: Secondary | ICD-10-CM | POA: Diagnosis not present

## 2015-03-14 DIAGNOSIS — E785 Hyperlipidemia, unspecified: Secondary | ICD-10-CM | POA: Insufficient documentation

## 2015-03-14 DIAGNOSIS — M2241 Chondromalacia patellae, right knee: Secondary | ICD-10-CM | POA: Insufficient documentation

## 2015-03-14 DIAGNOSIS — I10 Essential (primary) hypertension: Secondary | ICD-10-CM | POA: Insufficient documentation

## 2015-03-14 DIAGNOSIS — S83241A Other tear of medial meniscus, current injury, right knee, initial encounter: Secondary | ICD-10-CM | POA: Insufficient documentation

## 2015-03-14 DIAGNOSIS — Z955 Presence of coronary angioplasty implant and graft: Secondary | ICD-10-CM | POA: Insufficient documentation

## 2015-03-14 DIAGNOSIS — I2510 Atherosclerotic heart disease of native coronary artery without angina pectoris: Secondary | ICD-10-CM | POA: Insufficient documentation

## 2015-03-14 DIAGNOSIS — Z79899 Other long term (current) drug therapy: Secondary | ICD-10-CM | POA: Diagnosis not present

## 2015-03-14 DIAGNOSIS — Z87891 Personal history of nicotine dependence: Secondary | ICD-10-CM | POA: Diagnosis not present

## 2015-03-14 DIAGNOSIS — X58XXXA Exposure to other specified factors, initial encounter: Secondary | ICD-10-CM | POA: Diagnosis not present

## 2015-03-14 DIAGNOSIS — Z7902 Long term (current) use of antithrombotics/antiplatelets: Secondary | ICD-10-CM | POA: Diagnosis not present

## 2015-03-14 HISTORY — PX: CHONDROPLASTY: SHX5177

## 2015-03-14 HISTORY — DX: Sleep apnea, unspecified: G47.30

## 2015-03-14 HISTORY — PX: KNEE ARTHROSCOPY WITH MEDIAL MENISECTOMY: SHX5651

## 2015-03-14 HISTORY — PX: KNEE ARTHROSCOPY WITH EXCISION PLICA: SHX5647

## 2015-03-14 SURGERY — ARTHROSCOPY, KNEE, WITH MEDIAL MENISCECTOMY
Anesthesia: General | Site: Knee | Laterality: Right

## 2015-03-14 MED ORDER — PROPOFOL 10 MG/ML IV BOLUS
INTRAVENOUS | Status: AC
  Filled 2015-03-14: qty 20

## 2015-03-14 MED ORDER — BUPIVACAINE HCL (PF) 0.5 % IJ SOLN
INTRAMUSCULAR | Status: DC | PRN
  Administered 2015-03-14: 20 mL

## 2015-03-14 MED ORDER — SODIUM CHLORIDE 0.9 % IR SOLN
Status: DC | PRN
Start: 2015-03-14 — End: 2015-03-14
  Administered 2015-03-14: 6000 mL

## 2015-03-14 MED ORDER — OXYCODONE HCL 5 MG/5ML PO SOLN: 5.0000 mg | Freq: Once | ORAL | Status: DC | PRN

## 2015-03-14 MED ORDER — FENTANYL CITRATE (PF) 100 MCG/2ML IJ SOLN
INTRAMUSCULAR | Status: AC
  Filled 2015-03-14: qty 4

## 2015-03-14 MED ORDER — BUPIVACAINE HCL (PF) 0.5 % IJ SOLN
INTRAMUSCULAR | Status: AC
  Filled 2015-03-14: qty 30

## 2015-03-14 MED ORDER — LIDOCAINE HCL (CARDIAC) 20 MG/ML IV SOLN
INTRAVENOUS | Status: AC
  Filled 2015-03-14: qty 5

## 2015-03-14 MED ORDER — ONDANSETRON HCL 4 MG/2ML IJ SOLN
INTRAMUSCULAR | Status: DC | PRN
  Administered 2015-03-14: 4 mg via INTRAVENOUS

## 2015-03-14 MED ORDER — DEXAMETHASONE SODIUM PHOSPHATE 4 MG/ML IJ SOLN
INTRAMUSCULAR | Status: DC | PRN
  Administered 2015-03-14: 10 mg via INTRAVENOUS

## 2015-03-14 MED ORDER — CEFAZOLIN SODIUM-DEXTROSE 2-3 GM-% IV SOLR
INTRAVENOUS | Status: AC
  Filled 2015-03-14: qty 50

## 2015-03-14 MED ORDER — HYDROMORPHONE HCL 1 MG/ML IJ SOLN: 0.2500 mg | INTRAMUSCULAR | Status: DC | PRN

## 2015-03-14 MED ORDER — MEPERIDINE HCL 25 MG/ML IJ SOLN: 6.2500 mg | INTRAMUSCULAR | Status: DC | PRN

## 2015-03-14 MED ORDER — LIDOCAINE HCL (CARDIAC) 20 MG/ML IV SOLN
INTRAVENOUS | Status: DC | PRN
  Administered 2015-03-14: 50 mg via INTRAVENOUS

## 2015-03-14 MED ORDER — FENTANYL CITRATE (PF) 100 MCG/2ML IJ SOLN
INTRAMUSCULAR | Status: DC | PRN
  Administered 2015-03-14 (×2): 50 ug via INTRAVENOUS
  Administered 2015-03-14: 100 ug via INTRAVENOUS
  Administered 2015-03-14: 50 ug via INTRAVENOUS

## 2015-03-14 MED ORDER — EPINEPHRINE HCL 1 MG/ML IJ SOLN
INTRAMUSCULAR | Status: DC | PRN
  Administered 2015-03-14: 2 mg

## 2015-03-14 MED ORDER — BUPIVACAINE HCL (PF) 0.25 % IJ SOLN
INTRAMUSCULAR | Status: AC
  Filled 2015-03-14: qty 30

## 2015-03-14 MED ORDER — GLYCOPYRROLATE 0.2 MG/ML IJ SOLN: 0.2000 mg | Freq: Once | INTRAMUSCULAR | Status: DC | PRN

## 2015-03-14 MED ORDER — LACTATED RINGERS IV SOLN
INTRAVENOUS | Status: DC
  Administered 2015-03-14: 15:00:00 via INTRAVENOUS
  Administered 2015-03-14: 10 mL/h via INTRAVENOUS

## 2015-03-14 MED ORDER — EPINEPHRINE HCL 1 MG/ML IJ SOLN
INTRAMUSCULAR | Status: AC
  Filled 2015-03-14: qty 1

## 2015-03-14 MED ORDER — CEFAZOLIN SODIUM-DEXTROSE 2-3 GM-% IV SOLR
2.0000 g | INTRAVENOUS | Status: AC
  Administered 2015-03-14: 2 g via INTRAVENOUS

## 2015-03-14 MED ORDER — OXYCODONE-ACETAMINOPHEN 5-325 MG PO TABS: 1.0000 | ORAL_TABLET | Freq: Four times a day (QID) | ORAL | Status: DC | PRN

## 2015-03-14 MED ORDER — FENTANYL CITRATE (PF) 100 MCG/2ML IJ SOLN: 50.0000 ug | INTRAMUSCULAR | Status: DC | PRN

## 2015-03-14 MED ORDER — CHLORHEXIDINE GLUCONATE 4 % EX LIQD: 60.0000 mL | Freq: Once | CUTANEOUS | Status: DC

## 2015-03-14 MED ORDER — SCOPOLAMINE 1 MG/3DAYS TD PT72: 1.0000 | MEDICATED_PATCH | Freq: Once | TRANSDERMAL | Status: DC | PRN

## 2015-03-14 MED ORDER — MIDAZOLAM HCL 2 MG/2ML IJ SOLN: 1.0000 mg | INTRAMUSCULAR | Status: DC | PRN

## 2015-03-14 MED ORDER — ONDANSETRON HCL 4 MG/2ML IJ SOLN
INTRAMUSCULAR | Status: AC
  Filled 2015-03-14: qty 2

## 2015-03-14 MED ORDER — MIDAZOLAM HCL 2 MG/2ML IJ SOLN
INTRAMUSCULAR | Status: AC
  Filled 2015-03-14: qty 4

## 2015-03-14 MED ORDER — PROPOFOL 10 MG/ML IV BOLUS
INTRAVENOUS | Status: DC | PRN
  Administered 2015-03-14 (×2): 50 mg via INTRAVENOUS
  Administered 2015-03-14: 200 mg via INTRAVENOUS
  Administered 2015-03-14: 50 mg via INTRAVENOUS

## 2015-03-14 MED ORDER — MIDAZOLAM HCL 5 MG/5ML IJ SOLN
INTRAMUSCULAR | Status: DC | PRN
  Administered 2015-03-14: 2 mg via INTRAVENOUS

## 2015-03-14 MED ORDER — OXYCODONE HCL 5 MG PO TABS: 5.0000 mg | ORAL_TABLET | Freq: Once | ORAL | Status: DC | PRN

## 2015-03-14 SURGICAL SUPPLY — 38 items
BANDAGE ELASTIC 6 VELCRO ST LF (GAUZE/BANDAGES/DRESSINGS) ×3 IMPLANT
BLADE 4.2CUDA (BLADE) IMPLANT
BLADE GREAT WHITE 4.2 (BLADE) ×2 IMPLANT
BLADE GREAT WHITE 4.2MM (BLADE) ×1
CUTTER MENISCUS  4.2MM (BLADE)
CUTTER MENISCUS 4.2MM (BLADE) IMPLANT
DRAPE ARTHROSCOPY W/POUCH 114 (DRAPES) ×3 IMPLANT
DRSG EMULSION OIL 3X3 NADH (GAUZE/BANDAGES/DRESSINGS) ×3 IMPLANT
DRSG PAD ABDOMINAL 8X10 ST (GAUZE/BANDAGES/DRESSINGS) ×3 IMPLANT
DURAPREP 26ML APPLICATOR (WOUND CARE) ×3 IMPLANT
ELECT MENISCUS 165MM 90D (ELECTRODE) IMPLANT
ELECT REM PT RETURN 9FT ADLT (ELECTROSURGICAL)
ELECTRODE REM PT RTRN 9FT ADLT (ELECTROSURGICAL) IMPLANT
GAUZE SPONGE 4X4 12PLY STRL (GAUZE/BANDAGES/DRESSINGS) ×3 IMPLANT
GLOVE BIOGEL PI IND STRL 8 (GLOVE) ×1 IMPLANT
GLOVE BIOGEL PI INDICATOR 8 (GLOVE) ×2
GLOVE ECLIPSE 7.5 STRL STRAW (GLOVE) ×3 IMPLANT
GOWN STRL REUS W/ TWL LRG LVL3 (GOWN DISPOSABLE) ×1 IMPLANT
GOWN STRL REUS W/ TWL XL LVL3 (GOWN DISPOSABLE) ×2 IMPLANT
GOWN STRL REUS W/TWL LRG LVL3 (GOWN DISPOSABLE) ×2
GOWN STRL REUS W/TWL XL LVL3 (GOWN DISPOSABLE) ×4 IMPLANT
HOLDER KNEE FOAM BLUE (MISCELLANEOUS) ×3 IMPLANT
KNEE WRAP E Z 3 GEL PACK (MISCELLANEOUS) IMPLANT
MANIFOLD NEPTUNE II (INSTRUMENTS) ×3 IMPLANT
NDL SAFETY ECLIPSE 18X1.5 (NEEDLE) IMPLANT
NEEDLE HYPO 18GX1.5 SHARP (NEEDLE)
PACK ARTHROSCOPY DSU (CUSTOM PROCEDURE TRAY) ×3 IMPLANT
PACK BASIN DAY SURGERY FS (CUSTOM PROCEDURE TRAY) ×3 IMPLANT
PAD CAST 4YDX4 CTTN HI CHSV (CAST SUPPLIES) ×1 IMPLANT
PADDING CAST COTTON 4X4 STRL (CAST SUPPLIES) ×2
PENCIL BUTTON HOLSTER BLD 10FT (ELECTRODE) IMPLANT
SET ARTHROSCOPY TUBING (MISCELLANEOUS) ×2
SET ARTHROSCOPY TUBING LN (MISCELLANEOUS) ×1 IMPLANT
SUT ETHILON 4 0 PS 2 18 (SUTURE) ×3 IMPLANT
SYR 5ML LL (SYRINGE) ×3 IMPLANT
TOWEL OR 17X24 6PK STRL BLUE (TOWEL DISPOSABLE) ×3 IMPLANT
TOWEL OR NON WOVEN STRL DISP B (DISPOSABLE) ×3 IMPLANT
WATER STERILE IRR 1000ML POUR (IV SOLUTION) ×3 IMPLANT

## 2015-03-14 NOTE — Brief Op Note (Signed)
03/14/2015  3:30 PM  PATIENT:  Micheal Chavez  54 y.o. male  PRE-OPERATIVE DIAGNOSIS:  MEDIAL MENSICAL TEAR RIGHT KNEE   POST-OPERATIVE DIAGNOSIS:  MEDIAL MENSICAL TEAR RIGHT KNEE   PROCEDURE:  Procedure(s) with comments: RIGHT KNEE ARTHROSCOPY  (Right) - Femoral-patellar chondroplasty, medial femoral chondial, medial meniscus, medial plica excision.  SURGEON:  Surgeon(s) and Role:    * Jodi Geralds, MD - Primary  PHYSICIAN ASSISTANT:   ASSISTANTS: bethune   ANESTHESIA:   general  EBL:  Total I/O In: 1000 [I.V.:1000] Out: -   BLOOD ADMINISTERED:none  DRAINS: none   LOCAL MEDICATIONS USED:  MARCAINE     SPECIMEN:  No Specimen  DISPOSITION OF SPECIMEN:  N/A  COUNTS:  YES  TOURNIQUET:  * No tourniquets in log *  DICTATION: .Other Dictation: Dictation Number [id number]  PLAN OF CARE: Discharge to home after PACU  PATIENT DISPOSITION:  PACU - hemodynamically stable.   Delay start of Pharmacological VTE agent (>24hrs) due to surgical blood loss or risk of bleeding: no

## 2015-03-14 NOTE — Transfer of Care (Signed)
Immediate Anesthesia Transfer of Care Note  Patient: Micheal Chavez  Procedure(s) Performed: Procedure(s) with comments: RIGHT KNEE ARTHROSCOPY  (Right) - Patello-femoral chondroplasty, medial femoral chondyle chondroplasty with microfracture technique, medial menisectomy, medial plica excision.  Patient Location: PACU  Anesthesia Type:General  Level of Consciousness: awake and patient cooperative  Airway & Oxygen Therapy: Patient Spontanous Breathing and Patient connected to face mask oxygen  Post-op Assessment: Report given to RN and Post -op Vital signs reviewed and stable  Post vital signs: Reviewed and stable  Last Vitals:  Filed Vitals:   03/14/15 1413  BP: 156/96  Pulse: 71  Temp: 36.8 C  Resp: 20    Complications: No apparent anesthesia complications

## 2015-03-14 NOTE — Anesthesia Procedure Notes (Signed)
Procedure Name: LMA Insertion Date/Time: 03/14/2015 2:51 PM Performed by: Genevieve Norlander L Pre-anesthesia Checklist: Patient identified, Emergency Drugs available, Suction available, Patient being monitored and Timeout performed Patient Re-evaluated:Patient Re-evaluated prior to inductionOxygen Delivery Method: Circle System Utilized Preoxygenation: Pre-oxygenation with 100% oxygen Intubation Type: IV induction Ventilation: Mask ventilation without difficulty LMA: LMA inserted LMA Size: 5.0 Number of attempts: 1 Airway Equipment and Method: Bite block Placement Confirmation: positive ETCO2 Tube secured with: Tape Dental Injury: Teeth and Oropharynx as per pre-operative assessment

## 2015-03-14 NOTE — Anesthesia Postprocedure Evaluation (Signed)
  Anesthesia Post-op Note  Patient: RAYDER SULLENGER  Procedure(s) Performed: Procedure(s) with comments: RIGHT KNEE ARTHROSCOPY  (Right) - Patello-femoral chondroplasty, medial femoral chondyle chondroplasty with microfracture technique, medial menisectomy, medial plica excision.  Patient Location: PACU  Anesthesia Type: General   Level of Consciousness: awake, alert  and oriented  Airway and Oxygen Therapy: Patient Spontanous Breathing  Post-op Pain: mild  Post-op Assessment: Post-op Vital signs reviewed  Post-op Vital Signs: Reviewed  Last Vitals:  Filed Vitals:   03/14/15 1600  BP: 154/68  Pulse: 68  Temp:   Resp: 14    Complications: No apparent anesthesia complications

## 2015-03-14 NOTE — H&P (Signed)
PREOPERATIVE H&P  Chief Complaint: r knee pain  HPI: Micheal Chavez is a 54 y.o. male who presents for evaluation of r kneepain. It has been present for greater than 1 month and has been worsening. He has failed conservative measures. Pain is rated as moderate.  Past Medical History  Diagnosis Date  . Coronary artery disease     Status post drug-eluting stent to the RCA 06/29/10. --  cath 06/29/10: LAD 30%; Cfx 30-50%; EF 60-65%  . Hypertension   . Borderline diabetes mellitus   . Dyslipidemia   . Obesity   . Hyperlipidemia   . Glucose intolerance (impaired glucose tolerance)   . Hemorrhoids   . Previous back surgery   . Sleep apnea     Uses CPAP nightly   Past Surgical History  Procedure Laterality Date  . Hemorrhoid surgery      Status post hemorrhoid surgery x5  . Coronary angioplasty with stent placement  06/29/2010    Successful percutaneous transluminal coronary angioplasty  with placement of a drug-eluting stent in the proximal right coronary  artery  . Cardiac catheterization  06/29/2010    LAD 30%; Cfx 30-50%; EF 60-65%   . Eye surgery Bilateral 2011,2013  . Back surgery      ruptured disc lower back   Social History   Social History  . Marital Status: Married    Spouse Name: N/A  . Number of Children: N/A  . Years of Education: N/A   Social History Main Topics  . Smoking status: Former Smoker -- 0.50 packs/day for 10 years    Types: Cigarettes    Quit date: 08/08/2002  . Smokeless tobacco: None     Comment: quit 2004  . Alcohol Use: Yes     Comment: occasional  . Drug Use: No  . Sexual Activity: Not Asked   Other Topics Concern  . None   Social History Narrative   Family History  Problem Relation Age of Onset  . Heart attack Mother   . Heart attack Father    No Known Allergies Prior to Admission medications   Medication Sig Start Date End Date Taking? Authorizing Provider  aspirin 325 MG tablet Take 325 mg by mouth daily.   Yes Historical  Provider, MD  atorvastatin (LIPITOR) 80 MG tablet Take 1 tablet (80 mg total) by mouth daily. 07/20/11  Yes Wendall Stade, MD  bifidobacterium infantis (ALIGN) capsule Take 1 capsule by mouth daily.   Yes Historical Provider, MD  co-enzyme Q-10 50 MG capsule Take 200 mg by mouth daily.   Yes Historical Provider, MD  irbesartan (AVAPRO) 300 MG tablet Take 300 mg by mouth daily.   Yes Historical Provider, MD  prasugrel (EFFIENT) 10 MG TABS TAKE ONE TABLET BY MOUTH EVERY DAY 09/29/12  Yes Wendall Stade, MD  valsartan (DIOVAN) 320 MG tablet Take 1 tablet (320 mg total) by mouth daily. 08/02/11  Yes Wendall Stade, MD  ANDROGEL PUMP 20.25 MG/ACT (1.62%) GEL As directed 09/15/12   Historical Provider, MD  nitroGLYCERIN (NITROSTAT) 0.4 MG SL tablet Place 1 tablet (0.4 mg total) under the tongue every 5 (five) minutes as needed. 07/20/11   Wendall Stade, MD     Positive ROS: none  All other systems have been reviewed and were otherwise negative with the exception of those mentioned in the HPI and as above.  Physical Exam: Filed Vitals:   03/14/15 1413  BP: 156/96  Pulse: 71  Temp: 98.2 F (36.8  C)  Resp: 20    General: Alert, no acute distress Cardiovascular: No pedal edema Respiratory: No cyanosis, no use of accessory musculature GI: No organomegaly, abdomen is soft and non-tender Skin: No lesions in the area of chief complaint Neurologic: Sensation intact distally Psychiatric: Patient is competent for consent with normal mood and affect Lymphatic: No axillary or cervical lymphadenopathy  MUSCULOSKELETAL: r knee: pain on rom + med jt line tender -instability  Assessment/Plan: MEDIAL MENSICAL TEAR RIGHT KNEE  Plan for Procedure(s): RIGHT KNEE ARTHROSCOPY   The risks benefits and alternatives were discussed with the patient including but not limited to the risks of nonoperative treatment, versus surgical intervention including infection, bleeding, nerve injury, malunion, nonunion,  hardware prominence, hardware failure, need for hardware removal, blood clots, cardiopulmonary complications, morbidity, mortality, among others, and they were willing to proceed.  Predicted outcome is good, although there will be at least a six to nine month expected recovery.  Harvie Junior, MD 03/14/2015 2:43 PM

## 2015-03-14 NOTE — Anesthesia Preprocedure Evaluation (Addendum)
Anesthesia Evaluation  Patient identified by MRN, date of birth, ID band Patient awake    Reviewed: Allergy & Precautions, NPO status , Patient's Chart, lab work & pertinent test results  Airway Mallampati: I  TM Distance: >3 FB Neck ROM: Full    Dental  (+) Teeth Intact, Dental Advisory Given   Pulmonary sleep apnea and Continuous Positive Airway Pressure Ventilation , former smoker,    breath sounds clear to auscultation       Cardiovascular hypertension, Pt. on medications + CAD   Rhythm:Regular Rate:Normal     Neuro/Psych    GI/Hepatic   Endo/Other  Morbid obesity  Renal/GU      Musculoskeletal   Abdominal   Peds  Hematology   Anesthesia Other Findings   Reproductive/Obstetrics                            Anesthesia Physical Anesthesia Plan  ASA: III  Anesthesia Plan: General   Post-op Pain Management:    Induction: Intravenous  Airway Management Planned: LMA  Additional Equipment:   Intra-op Plan:   Post-operative Plan: Extubation in OR  Informed Consent: I have reviewed the patients History and Physical, chart, labs and discussed the procedure including the risks, benefits and alternatives for the proposed anesthesia with the patient or authorized representative who has indicated his/her understanding and acceptance.   Dental advisory given  Plan Discussed with: CRNA, Anesthesiologist and Surgeon  Anesthesia Plan Comments:         Anesthesia Quick Evaluation

## 2015-03-14 NOTE — Discharge Instructions (Signed)
POST-OP KNEE ARTHROSCOPY INSTRUCTIONS  °Dr. John Graves/Jim Bethune PA-C ° °Pain °You will be expected to have a moderate amount of pain in the affected knee for approximately two weeks. However, the first two days will be the most severe pain. A prescription has been provided to take as needed for the pain. The pain can be reduced by applying ice packs to the knee for the first 1-2 weeks post surgery. Also, keeping the leg elevated on pillows will help alleviate the pain. If you develop any acute pain or swelling in your calf muscle, please call the doctor. ° °Activity °It is preferred that you stay at bed rest for approximately 24 hours. However, you may go to the bathroom with help. Weight bearing as tolerated. You may begin the knee exercises the day of surgery. Discontinue crutches as the knee pain resolves. ° °Dressing °Keep the dressing dry. If the ace bandage should wrinkle or roll up, this can be rewrapped to prevent ridges in the bandage. You may remove all dressings in 48 hours,  apply bandaids to each wound. You may shower on the 4th day after surgery but no tub bath. ° °Symptoms to report to your doctor °Extreme pain °Extreme swelling °Temperature above 101 degrees °Change in the feeling, color, or movement of your toes °Redness, heat, or swelling at your incision ° °Exercise °If is preferred that as soon as possible you try to do a straight leg raise without bending the knee and concentrate on bringing the heel of your foot off the bed up to approximately 45 degrees and hold for the count of 10 seconds. Repeat this at least 10 times three or four times per day. Additional exercises are provided below. ° °You are encouraged to bend the knee as tolerated. ° °Follow-Up °Call to schedule a follow-up appointment in 5-7 days. Our office # is [phone]. ° °POST-OP EXERCISES ° °Short Arc Quads ° °1. Lie on back with legs straight. Place towel roll under thigh, just above knee. °2. Tighten thigh muscles to  straighten knee and lift heel off bed. °3. Hold for slow count of five, then lower. °4. Do three sets of ten ° ° ° °Straight Leg Raises ° °1. Lie on back with operative leg straight and other leg bent. °2. Keeping operative leg completely straight, slowly lift operative leg so foot is 5 inches off bed. °3. Hold for slow count of five, then lower. °4. Do three sets of ten. ° ° ° °DO BOTH EXERCISES 2 TIMES A DAY ° °Ankle Pumps ° °Work/move the operative ankle and foot up and down 10 times every hour while awake. ° ° °Post Anesthesia Home Care Instructions ° °Activity: °Get plenty of rest for the remainder of the day. A responsible adult should stay with you for 24 hours following the procedure.  °For the next 24 hours, DO NOT: °-Drive a car °-Operate machinery °-Drink alcoholic beverages °-Take any medication unless instructed by your physician °-Make any legal decisions or sign important papers. ° °Meals: °Start with liquid foods such as gelatin or soup. Progress to regular foods as tolerated. Avoid greasy, spicy, heavy foods. If nausea and/or vomiting occur, drink only clear liquids until the nausea and/or vomiting subsides. Call your physician if vomiting continues. ° °Special Instructions/Symptoms: °Your throat may feel dry or sore from the anesthesia or the breathing tube placed in your throat during surgery. If this causes discomfort, gargle with warm salt water. The discomfort should disappear within 24 hours. ° °If you   had a scopolamine patch placed behind your ear for the management of post- operative nausea and/or vomiting: ° °1. The medication in the patch is effective for 72 hours, after which it should be removed.  Wrap patch in a tissue and discard in the trash. Wash hands thoroughly with soap and water. °2. You may remove the patch earlier than 72 hours if you experience unpleasant side effects which may include dry mouth, dizziness or visual disturbances. °3. Avoid touching the patch. Wash your hands  with soap and water after contact with the patch. °  ° °

## 2015-03-15 ENCOUNTER — Encounter (HOSPITAL_BASED_OUTPATIENT_CLINIC_OR_DEPARTMENT_OTHER): Payer: Self-pay | Admitting: Orthopedic Surgery

## 2015-03-15 NOTE — Op Note (Signed)
NAMEPINKNEY, Micheal NO.:  [id number]  MEDICAL RECORD NO.:  [id number]  LOCATION:                               FACILITY:  [facility]  PHYSICIAN:  Harvie Junior, M.D.   DATE OF BIRTH:  December 12, 1960  DATE OF PROCEDURE:  03/14/2015 DATE OF DISCHARGE:  03/14/2015                              OPERATIVE REPORT   PREOPERATIVE DIAGNOSIS:  Medial meniscal tear.  POSTOPERATIVE DIAGNOSIS: 1. Posterior horn medial meniscal tear. 2. Severe grade 4 chondromalacia medial femoral condyle over a 2 x 3     cm area. 3. Medial shelf plica. 4. Chondromalacia patellofemoral joint.  PROCEDURE: 1. Partial posterior horn medial meniscectomy. 2. Chondroplasty medial femoral condyle with vascular enhancement,     drilling of the medial femoral condyle. 3. Medial shelf plica excision. 4. Chondroplasty patellofemoral joint, particularly posterior patella.  SURGEON:  Harvie Junior, M.D.  ASSISTANT:  Marshia Ly, PA  ANESTHESIA:  General.  BRIEF HISTORY:  Micheal Chavez is a 54 year old male with a history of significant complaints of medial-sided right knee pain.  After failure of conservative care, he was taken to the operating room for right knee arthroscopy.  He had failed conservative care including injection therapy, activity modification, and weight loss.  The patient was continuing to have medial side, subsequently we talked about the possibility of MRI examination, he felt that he would rather just proceed to arthroscopic intervention and we felt fairly certain that he was going to have pathology in the medial compartment, so he is brought to the operating room for this procedure.  DESCRIPTION OF PROCEDURE:  The patient was brought to the operative room.  After adequate anesthesia was obtained with general anesthetic, the patient was placed supine on the operating table.  The right leg was then prepped and draped in usual sterile fashion.  Following this, routine  arthroscopic examination of the knee revealed there was chondromalacia of the patellofemoral trochlea, in particular the posterior aspect of patella grade 2 and some small grade 3 in nature. This was debrided back to a smooth and stable rim.  This was large draping medial shelf plica, which was debrided allowing access into the medial compartment.  Once we gained access into the medial compartment, unfortunately there was a very large area of grade 4 change with exposed bone on the end of the femur.  This was initially about a 2 x 2 cm area. As we tracked it backwards, there was failing cartilage posteriorly and we debrided this back to about a 2 x 3 cm area of exposed bone.  Once we did this, we were able to get a better look in the back and we could now see the complex posterior horn medial meniscus, which was debrided back to a smooth and stable rim.  There was a flap coming anteriorly.  Once this was done, attention was turned to the medial femoral condyle where we took a vascular enhancement of all and hammered about 10 vascular enhancement channels into this large 2 x 3 cm area of completely denuded bone.  Once we did that, we went and took down the rest of the plica, went to the ACL,  which was normal, lateral side normal, back up to the patellofemoral joint, which had chondromalacia on the posterior aspect of the patella.  Once this was done, the knee was copiously and thoroughly irrigated, suctioned dry.  Arthroscopic portals were closed with a bandage.  Sterile compressive dressing was applied.  The patient was taken to the recovery room and was noted to be in a satisfactory condition.  Estimated blood loss for the procedure was minimal.     Harvie Junior, M.D.     Ranae Plumber  D:  03/14/2015  T:  03/14/2015  Job:  [id number]  cc:   Harvie Junior, M.D.

## 2016-09-24 ENCOUNTER — Encounter: Payer: Self-pay | Admitting: Cardiovascular Disease

## 2016-10-08 ENCOUNTER — Ambulatory Visit: Payer: BLUE CROSS/BLUE SHIELD | Admitting: Cardiovascular Disease

## 2016-12-20 NOTE — Progress Notes (Signed)
Patient ID: Micheal FlavinSamuel M Chavez, male   DOB: [birth year], 56 y.o.   MRN: [id number] Micheal Chavez is a 56 y.o.  male with hypertension, hyperlipidemia and borderline diabetes mellitus who [facility] June 28 2010 with chest discomfort consistent with unstable angina pectoris. He ruled out for myocardial infarction. Cardiac catheterization performed in the 06/29/10 demonstrated a 95% lesion in the RCA which was successfully treated with a drug-eluting stent. His P2Y12 was 0% on Plavix and he was switched Effient. Echocardiogram on 1/4 demonstrated an ejection fraction of 60-65%, mild LVH and mild left atrial enlargement. He returns for followup today.   Normal myovue 2/13    Has not been seen in 4 years   Concerned about his heart Having SSCP not always with exertion can occur while golfing.  Going on last 2 months No rest pain Also with exertional dyspnea   ROS: Denies fever, malais, weight loss, blurry vision, decreased visual acuity, cough, sputum, SOB, hemoptysis, pleuritic pain, palpitaitons, heartburn, abdominal pain, melena, lower extremity edema, claudication, or rash.  All other systems reviewed and negative  General: Affect appropriate Obese white male  HEENT: normal Neck supple with no adenopathy JVP normal no bruits no thyromegaly Lungs clear with no wheezing and good diaphragmatic motion Heart:  S1/S2 no murmur, no rub, gallop or click PMI normal Abdomen: benighn, BS positve, no tenderness, no AAA no bruit.  No HSM or HJR Distal pulses intact with no bruits No edema Neuro non-focal Skin warm and dry No muscular weakness   Current Outpatient Prescriptions  Medication Sig Dispense Refill  . aspirin 325 MG tablet Take 325 mg by mouth daily.    Marland Kitchen. atorvastatin (LIPITOR) 80 MG tablet Take 1 tablet (80 mg total) by mouth daily. 30 tablet 6  . bifidobacterium infantis (ALIGN) capsule Take 1 capsule by mouth daily.    . chlorthalidone (HYGROTON) 25 MG tablet  Take 1 tablet by mouth daily.    . Coenzyme Q10 (CO Q 10 PO) Take 400 mg by mouth daily.    . irbesartan (AVAPRO) 300 MG tablet Take 300 mg by mouth daily.    . metFORMIN (GLUCOPHAGE) 1000 MG tablet Take 2,000 mg by mouth daily.    . nitroGLYCERIN (NITROSTAT) 0.4 MG SL tablet Place 1 tablet (0.4 mg total) under the tongue every 5 (five) minutes as needed for chest pain (MAX 3 TABLETS). 25 tablet 3  . isosorbide mononitrate (IMDUR) 30 MG 24 hr tablet Take 1 tablet (30 mg total) by mouth daily. 90 tablet 3  . metoprolol tartrate (LOPRESSOR) 25 MG tablet Take 0.5 tablets (12.5 mg total) by mouth 2 (two) times daily. 90 tablet 3   No current facility-administered medications for this visit.     Allergies  Patient has no known allergies.  Electrocardiogram:  NSR rate 70 normal 12/21/16  SR rate 87 normal   Assessment and Plan  1. CAD:  Post stent to RCA in 2012 normal myovue 2013 no longer on DAT His symptoms are a bit Atypical but he is overly anxious about symptoms and has known disease Obesity increases risk  Of poor quality myovue. Discussed options and favor diagnostic cat Will arrange for next week Start lopressor/ imdur Refill SL nitro called in Labs ordered cath lab called for 7/3 with Dr Eldridge DaceVaranasi  2. HTN:  Well controlled.  Continue current medications and low sodium Dash type diet.   3. Chol:  Need labs from primary would like to decrease lipitor to 40  mg to minimize risk of long term side effects Lab Results  Component Value Date   LDLCALC 70 09/08/2010

## 2016-12-21 ENCOUNTER — Other Ambulatory Visit: Payer: Self-pay | Admitting: Cardiovascular Disease

## 2016-12-21 ENCOUNTER — Encounter: Payer: Self-pay | Admitting: Cardiovascular Disease

## 2016-12-21 ENCOUNTER — Ambulatory Visit (INDEPENDENT_AMBULATORY_CARE_PROVIDER_SITE_OTHER): Payer: BLUE CROSS/BLUE SHIELD | Admitting: Cardiovascular Disease

## 2016-12-21 ENCOUNTER — Encounter (INDEPENDENT_AMBULATORY_CARE_PROVIDER_SITE_OTHER): Payer: Self-pay

## 2016-12-21 VITALS — BP 146/80 | HR 87 | Ht 71.0 in | Wt 284.0 lb

## 2016-12-21 DIAGNOSIS — R0789 Other chest pain: Secondary | ICD-10-CM

## 2016-12-21 MED ORDER — ISOSORBIDE MONONITRATE ER 30 MG PO TB24: 30.0000 mg | ORAL_TABLET | Freq: Every day | ORAL | 3 refills | Status: DC

## 2016-12-21 MED ORDER — METOPROLOL TARTRATE 25 MG PO TABS: 12.5000 mg | ORAL_TABLET | Freq: Two times a day (BID) | ORAL | 3 refills | Status: DC

## 2016-12-21 MED ORDER — NITROGLYCERIN 0.4 MG SL SUBL: 0.4000 mg | SUBLINGUAL_TABLET | SUBLINGUAL | 3 refills | Status: DC | PRN

## 2016-12-21 NOTE — Patient Instructions (Addendum)
Medication Instructions:  Your physician has recommended you make the following change in your medication:  1-Start Metoprolol 12.5 mg by mouth twice daily 2-Start Imdur 30 mg by mouth daily 3-Start Nitroglycerin 0.4 mg under your tongue as needed for chest pain-   Take 1 NTG, under your tongue, while sitting. If no relief of pain may repeat NTG, one tab every 5 minutes up to 3 tablets total over 15 minutes. If no relief CALL 911. If you have dizziness/lightheadness while taking NTG, stop taking and call 911.  Labwork: Your physician recommends that you have lab work done today- BMET, CBC, PT/INR  Testing/Procedures: Your physician has requested that you have a cardiac catheterization. Cardiac catheterization is used to diagnose and/or treat various heart conditions. Doctors may recommend this procedure for a number of different reasons. The most common reason is to evaluate chest pain. Chest pain can be a symptom of coronary artery disease (CAD), and cardiac catheterization can show whether plaque is narrowing or blocking your heart's arteries. This procedure is also used to evaluate the valves, as well as measure the blood flow and oxygen levels in different parts of your heart. For further information please visit [email or web address]. Please follow instruction sheet, as given.  Follow-Up: Your physician wants you to follow-up in: 2 weeks with APP after heart catheterization.   If you need a refill on your cardiac medications before your next appointment, please call your pharmacy.    [facility] [address], [address] [place] [address] Dept: [phone] Loc: [phone]  Wilma FlavinSamuel M Rattan  12/21/2016  You are scheduled for a Cardiac Catheterization on Tuesday, July 3 with Dr. Lance MussJayadeep Varanasi.  1. Please arrive [facility] the [facility] at [facility]: [address] [place], [place] [address] at 8:30 AM (two hours before your procedure to ensure your preparation). Free valet parking service is available.   Special note: Every effort is made to have your procedure done on time. Please understand that emergencies sometimes delay scheduled procedures.  2. Diet: Do not eat or drink anything after midnight prior to your procedure except sips of water to take medications.  3. Labs: None needed.  4. Medication instructions in preparation for your procedure:  Stop taking, Glucophage (Metformin) on Tuesday, July 3.    On the morning of your procedure, take your Aspirin and any morning medicines NOT listed above.  You may use sips of water.  5. Plan for one night stay--bring personal belongings. 6. Bring a current list of your medications and current insurance cards. 7. You MUST have a responsible person to drive you home. 8. Someone MUST be with you the first 24 hours after you arrive home or your discharge will be delayed. 9. Please wear clothes that are easy to get on and off and wear slip-on shoes.  Thank you for allowing us to care for you!   -- [place]

## 2016-12-22 LAB — CBC WITH DIFFERENTIAL/PLATELET
Basophils Absolute: 0 10*3/uL (ref 0.0–0.2)
Basos: 0 %
EOS (ABSOLUTE): 0.1 10*3/uL (ref 0.0–0.4)
EOS: 2 %
HEMATOCRIT: 30.5 % — AB (ref 37.5–51.0)
HEMOGLOBIN: 8.9 g/dL — AB (ref 13.0–17.7)
IMMATURE GRANULOCYTES: 0 %
Immature Grans (Abs): 0 10*3/uL (ref 0.0–0.1)
LYMPHS ABS: 2.3 10*3/uL (ref 0.7–3.1)
Lymphs: 31 %
MCH: 21.4 pg — ABNORMAL LOW (ref 26.6–33.0)
MCHC: 29.2 g/dL — ABNORMAL LOW (ref 31.5–35.7)
MCV: 73 fL — ABNORMAL LOW (ref 79–97)
MONOCYTES: 9 %
Monocytes Absolute: 0.7 10*3/uL (ref 0.1–0.9)
Neutrophils Absolute: 4.4 10*3/uL (ref 1.4–7.0)
Neutrophils: 58 %
Platelets: 389 10*3/uL — ABNORMAL HIGH (ref 150–379)
RBC: 4.16 x10E6/uL (ref 4.14–5.80)
RDW: 16.9 % — ABNORMAL HIGH (ref 12.3–15.4)
WBC: 7.6 10*3/uL (ref 3.4–10.8)

## 2016-12-22 LAB — BASIC METABOLIC PANEL
BUN/Creatinine Ratio: 13 (ref 9–20)
BUN: 11 mg/dL (ref 6–24)
CALCIUM: 9.7 mg/dL (ref 8.7–10.2)
CO2: 26 mmol/L (ref 20–29)
CREATININE: 0.84 mg/dL (ref 0.76–1.27)
Chloride: 99 mmol/L (ref 96–106)
GFR calc Af Amer: 113 mL/min/{1.73_m2} (ref >59)
GFR, EST NON AFRICAN AMERICAN: 98 mL/min/{1.73_m2} (ref >59)
Glucose: 170 mg/dL — ABNORMAL HIGH (ref 65–99)
Potassium: 3.9 mmol/L (ref 3.5–5.2)
Sodium: 139 mmol/L (ref 134–144)

## 2016-12-22 LAB — PROTIME-INR
INR: 0.9 (ref 0.8–1.2)
PROTHROMBIN TIME: 10.1 s (ref 9.1–12.0)

## 2016-12-24 ENCOUNTER — Telehealth: Payer: Self-pay

## 2016-12-24 NOTE — Telephone Encounter (Signed)
Patient contacted [facility] at [facility] scheduled for: 12/25/2016 @ 1030  Verified arrival time and place: Gave direction to admitting-verified arrival time of 0800  Confirmed AM meds to be taken pre-cath with sip of water:  Verified patient to take ASA prior to arrival.  Instructed patient to hold metformin today 12/24/2016  Confirmed patient has responsible person to drive home post procedure and observe patient for 24 hours:  Yes  Addl concerns:  None noted

## 2016-12-25 ENCOUNTER — Encounter (HOSPITAL_COMMUNITY): Payer: Self-pay | Admitting: Interventional Cardiology

## 2016-12-25 ENCOUNTER — Ambulatory Visit (HOSPITAL_COMMUNITY)
Admission: RE | Admit: 2016-12-25 | Discharge: 2016-12-25 | Disposition: A | Discharge status: Home / Self Care | Payer: BLUE CROSS/BLUE SHIELD | Source: Ambulatory Visit | Attending: Interventional Cardiology | Admitting: Interventional Cardiology

## 2016-12-25 ENCOUNTER — Encounter (HOSPITAL_COMMUNITY)
Admission: RE | Disposition: A | Discharge status: Home / Self Care | Payer: Self-pay | Source: Ambulatory Visit | Attending: Interventional Cardiology

## 2016-12-25 DIAGNOSIS — Z7984 Long term (current) use of oral hypoglycemic drugs: Secondary | ICD-10-CM | POA: Diagnosis not present

## 2016-12-25 DIAGNOSIS — R7309 Other abnormal glucose: Secondary | ICD-10-CM | POA: Diagnosis not present

## 2016-12-25 DIAGNOSIS — I25119 Atherosclerotic heart disease of native coronary artery with unspecified angina pectoris: Secondary | ICD-10-CM

## 2016-12-25 DIAGNOSIS — Z79899 Other long term (current) drug therapy: Secondary | ICD-10-CM | POA: Insufficient documentation

## 2016-12-25 DIAGNOSIS — Z7982 Long term (current) use of aspirin: Secondary | ICD-10-CM | POA: Diagnosis not present

## 2016-12-25 DIAGNOSIS — I10 Essential (primary) hypertension: Secondary | ICD-10-CM | POA: Diagnosis not present

## 2016-12-25 DIAGNOSIS — E785 Hyperlipidemia, unspecified: Secondary | ICD-10-CM | POA: Insufficient documentation

## 2016-12-25 DIAGNOSIS — I25110 Atherosclerotic heart disease of native coronary artery with unstable angina pectoris: Secondary | ICD-10-CM | POA: Insufficient documentation

## 2016-12-25 DIAGNOSIS — I209 Angina pectoris, unspecified: Secondary | ICD-10-CM

## 2016-12-25 HISTORY — PX: LEFT HEART CATH AND CORONARY ANGIOGRAPHY: CATH118249

## 2016-12-25 LAB — CBC
HCT: 31.8 % — ABNORMAL LOW (ref 39.0–52.0)
Hemoglobin: 9 g/dL — ABNORMAL LOW (ref 13.0–17.0)
MCH: 21.3 pg — AB (ref 26.0–34.0)
MCHC: 28.3 g/dL — AB (ref 30.0–36.0)
MCV: 75.4 fL — ABNORMAL LOW (ref 78.0–100.0)
PLATELETS: 360 10*3/uL (ref 150–400)
RBC: 4.22 MIL/uL (ref 4.22–5.81)
RDW: 16.1 % — ABNORMAL HIGH (ref 11.5–15.5)
WBC: 7.1 10*3/uL (ref 4.0–10.5)

## 2016-12-25 LAB — GLUCOSE, CAPILLARY: Glucose-Capillary: 139 mg/dL — ABNORMAL HIGH (ref 65–99)

## 2016-12-25 SURGERY — LEFT HEART CATH AND CORONARY ANGIOGRAPHY
Anesthesia: LOCAL

## 2016-12-25 MED ORDER — ASPIRIN 81 MG PO CHEW
81.0000 mg | CHEWABLE_TABLET | ORAL | Status: AC
Start: 2016-12-26 — End: ?

## 2016-12-25 MED ORDER — LIDOCAINE HCL 1 % IJ SOLN
INTRAMUSCULAR | Status: AC
  Filled 2016-12-25: qty 20

## 2016-12-25 MED ORDER — MIDAZOLAM HCL 2 MG/2ML IJ SOLN
INTRAMUSCULAR | Status: DC | PRN
  Administered 2016-12-25: 2 mg via INTRAVENOUS

## 2016-12-25 MED ORDER — SODIUM CHLORIDE 0.9 % IV SOLN: 250.0000 mL | INTRAVENOUS | Status: DC | PRN

## 2016-12-25 MED ORDER — LIDOCAINE HCL (PF) 1 % IJ SOLN
INTRAMUSCULAR | Status: DC | PRN
  Administered 2016-12-25: 2 mL

## 2016-12-25 MED ORDER — SODIUM CHLORIDE 0.9% FLUSH: 3.0000 mL | INTRAVENOUS | Status: DC | PRN

## 2016-12-25 MED ORDER — SODIUM CHLORIDE 0.9 % WEIGHT BASED INFUSION: 1.0000 mL/kg/h | INTRAVENOUS | Status: DC

## 2016-12-25 MED ORDER — HEPARIN (PORCINE) IN NACL 2-0.9 UNIT/ML-% IJ SOLN
INTRAMUSCULAR | Status: DC | PRN
  Administered 2016-12-25: 11:00:00

## 2016-12-25 MED ORDER — HEPARIN SODIUM (PORCINE) 1000 UNIT/ML IJ SOLN
INTRAMUSCULAR | Status: DC | PRN
  Administered 2016-12-25: 6000 [IU] via INTRAVENOUS

## 2016-12-25 MED ORDER — MIDAZOLAM HCL 2 MG/2ML IJ SOLN
INTRAMUSCULAR | Status: AC
Start: 2016-12-25 — End: ?
  Filled 2016-12-25: qty 2

## 2016-12-25 MED ORDER — FENTANYL CITRATE (PF) 100 MCG/2ML IJ SOLN
INTRAMUSCULAR | Status: DC | PRN
  Administered 2016-12-25: 25 ug via INTRAVENOUS

## 2016-12-25 MED ORDER — ASPIRIN 81 MG PO CHEW: 81.0000 mg | CHEWABLE_TABLET | ORAL | Status: DC

## 2016-12-25 MED ORDER — SODIUM CHLORIDE 0.9 % IV SOLN: INTRAVENOUS | Status: AC

## 2016-12-25 MED ORDER — SODIUM CHLORIDE 0.9% FLUSH: 3.0000 mL | Freq: Two times a day (BID) | INTRAVENOUS | Status: DC

## 2016-12-25 MED ORDER — VERAPAMIL HCL 2.5 MG/ML IV SOLN
INTRAVENOUS | Status: AC
  Filled 2016-12-25: qty 2

## 2016-12-25 MED ORDER — IOPAMIDOL (ISOVUE-370) INJECTION 76%
INTRAVENOUS | Status: AC
  Filled 2016-12-25: qty 100

## 2016-12-25 MED ORDER — VERAPAMIL HCL 2.5 MG/ML IV SOLN
INTRAVENOUS | Status: DC | PRN
  Administered 2016-12-25: 10:00:00 via INTRA_ARTERIAL

## 2016-12-25 MED ORDER — HEPARIN (PORCINE) IN NACL 2-0.9 UNIT/ML-% IJ SOLN
INTRAMUSCULAR | Status: AC
  Filled 2016-12-25: qty 1000

## 2016-12-25 MED ORDER — SODIUM CHLORIDE 0.9 % WEIGHT BASED INFUSION
3.0000 mL/kg/h | INTRAVENOUS | Status: AC
  Administered 2016-12-25: 3 mL/kg/h via INTRAVENOUS

## 2016-12-25 MED ORDER — HEPARIN SODIUM (PORCINE) 1000 UNIT/ML IJ SOLN
INTRAMUSCULAR | Status: AC
  Filled 2016-12-25: qty 1

## 2016-12-25 MED ORDER — FENTANYL CITRATE (PF) 100 MCG/2ML IJ SOLN
INTRAMUSCULAR | Status: AC
  Filled 2016-12-25: qty 2

## 2016-12-25 MED ORDER — IOPAMIDOL (ISOVUE-370) INJECTION 76%
INTRAVENOUS | Status: DC | PRN
  Administered 2016-12-25: 70 mL via INTRAVENOUS

## 2016-12-25 SURGICAL SUPPLY — 12 items
CATH EXPO 5F FL3.5 (CATHETERS) ×2 IMPLANT
CATH INFINITI 5FR ANG PIGTAIL (CATHETERS) ×2 IMPLANT
CATH INFINITI JR4 5F (CATHETERS) ×2 IMPLANT
DEVICE RAD COMP TR BAND LRG (VASCULAR PRODUCTS) ×2 IMPLANT
GLIDESHEATH SLEND SS 6F .021 (SHEATH) ×2 IMPLANT
GUIDEWIRE INQWIRE 1.5J.035X260 (WIRE) ×1 IMPLANT
INQWIRE 1.5J .035X260CM (WIRE) ×2
KIT HEART LEFT (KITS) ×2 IMPLANT
PACK CARDIAC CATHETERIZATION (CUSTOM PROCEDURE TRAY) ×2 IMPLANT
SYR MEDRAD MARK V 150ML (SYRINGE) ×2 IMPLANT
TRANSDUCER W/STOPCOCK (MISCELLANEOUS) ×2 IMPLANT
TUBING CIL FLEX 10 FLL-RA (TUBING) ×2 IMPLANT

## 2016-12-25 NOTE — Discharge Instructions (Signed)
NO METFORMIN/GLUCOPHAGE FOR 2 DAYS ° ° °Radial Site Care °Refer to this sheet in the next few weeks. These instructions provide you with information about caring for yourself after your procedure. Your health care provider may also give you more specific instructions. Your treatment has been planned according to current medical practices, but problems sometimes occur. Call your health care provider if you have any problems or questions after your procedure. °What can I expect after the procedure? °After your procedure, it is typical to have the following: °· Bruising at the radial site that usually fades within 1-2 weeks. °· Blood collecting in the tissue (hematoma) that may be painful to the touch. It should usually decrease in size and tenderness within 1-2 weeks. ° °Follow these instructions at home: °· Take medicines only as directed by your health care provider. °· You may shower 24-48 hours after the procedure or as directed by your health care provider. Remove the bandage (dressing) and gently wash the site with plain soap and water. Pat the area dry with a clean towel. Do not rub the site, because this may cause bleeding. °· Do not take baths, swim, or use a hot tub until your health care provider approves. °· Check your insertion site every day for redness, swelling, or drainage. °· Do not apply powder or lotion to the site. °· Do not flex or bend the affected arm for 24 hours or as directed by your health care provider. °· Do not push or pull heavy objects with the affected arm for 24 hours or as directed by your health care provider. °· Do not lift over 10 lb (4.5 kg) for 5 days after your procedure or as directed by your health care provider. °· Ask your health care provider when it is okay to: °? Return to work or school. °? Resume usual physical activities or sports. °? Resume sexual activity. °· Do not drive home if you are discharged the same day as the procedure. Have someone else drive you. °· You  may drive 24 hours after the procedure unless otherwise instructed by your health care provider. °· Do not operate machinery or power tools for 24 hours after the procedure. °· If your procedure was done as an outpatient procedure, which means that you went home the same day as your procedure, a responsible adult should be with you for the first 24 hours after you arrive home. °· Keep all follow-up visits as directed by your health care provider. This is important. °Contact a health care provider if: °· You have a fever. °· You have chills. °· You have increased bleeding from the radial site. Hold pressure on the site. °Get help right away if: °· You have unusual pain at the radial site. °· You have redness, warmth, or swelling at the radial site. °· You have drainage (other than a small amount of blood on the dressing) from the radial site. °· The radial site is bleeding, and the bleeding does not stop after 30 minutes of holding steady pressure on the site. °· Your arm or hand becomes pale, cool, tingly, or numb. °This information is not intended to replace advice given to you by your health care provider. Make sure you discuss any questions you have with your health care provider. °Document Released: 07/14/2010 Document Revised: 11/17/2015 Document Reviewed: 12/28/2013 °Elsevier Interactive Patient Education © 2018 [employer] Inc. ° °

## 2016-12-25 NOTE — Progress Notes (Signed)
Client and his wife notified to continue metoprolol and may continue or stop imdur and they voiced understanding

## 2016-12-25 NOTE — Progress Notes (Signed)
Client asking about home meds imdur and metoprolol and called Lindsay,PA and per Lindsay,PA client should continue metoprolol and may stop imdur if he would like to and they will review medications at his next office visit

## 2016-12-25 NOTE — H&P (View-Only) (Signed)
Patient ID: Micheal Chavez, male   DOB: [birth year], 56 y.o.   MRN: [id number] Micheal Chavez is a 56 y.o.  male with hypertension, hyperlipidemia and borderline diabetes mellitus who [facility] to [facility] on June 28 2010 with chest discomfort consistent with unstable angina pectoris. He ruled out for myocardial infarction. Cardiac catheterization performed in the 06/29/10 demonstrated a 95% lesion in the RCA which was successfully treated with a drug-eluting stent. His P2Y12 was 0% on Plavix and he was switched Effient. Echocardiogram on 1/4 demonstrated an ejection fraction of 60-65%, mild LVH and mild left atrial enlargement. He returns for followup today.   Normal myovue 2/13    Has not been seen in 4 years   Concerned about his heart Having SSCP not always with exertion can occur while golfing.  Going on last 2 months No rest pain Also with exertional dyspnea   ROS: Denies fever, malais, weight loss, blurry vision, decreased visual acuity, cough, sputum, SOB, hemoptysis, pleuritic pain, palpitaitons, heartburn, abdominal pain, melena, lower extremity edema, claudication, or rash.  All other systems reviewed and negative  General: Affect appropriate Obese white male  HEENT: normal Neck supple with no adenopathy JVP normal no bruits no thyromegaly Lungs clear with no wheezing and good diaphragmatic motion Heart:  S1/S2 no murmur, no rub, gallop or click PMI normal Abdomen: benighn, BS positve, no tenderness, no AAA no bruit.  No HSM or HJR Distal pulses intact with no bruits No edema Neuro non-focal Skin warm and dry No muscular weakness   Current Outpatient Prescriptions  Medication Sig Dispense Refill  . aspirin 325 MG tablet Take 325 mg by mouth daily.    Marland Kitchen. atorvastatin (LIPITOR) 80 MG tablet Take 1 tablet (80 mg total) by mouth daily. 30 tablet 6  . bifidobacterium infantis (ALIGN) capsule Take 1 capsule by mouth daily.    . chlorthalidone (HYGROTON) 25 MG tablet  Take 1 tablet by mouth daily.    . Coenzyme Q10 (CO Q 10 PO) Take 400 mg by mouth daily.    . irbesartan (AVAPRO) 300 MG tablet Take 300 mg by mouth daily.    . metFORMIN (GLUCOPHAGE) 1000 MG tablet Take 2,000 mg by mouth daily.    . nitroGLYCERIN (NITROSTAT) 0.4 MG SL tablet Place 1 tablet (0.4 mg total) under the tongue every 5 (five) minutes as needed for chest pain (MAX 3 TABLETS). 25 tablet 3  . isosorbide mononitrate (IMDUR) 30 MG 24 hr tablet Take 1 tablet (30 mg total) by mouth daily. 90 tablet 3  . metoprolol tartrate (LOPRESSOR) 25 MG tablet Take 0.5 tablets (12.5 mg total) by mouth 2 (two) times daily. 90 tablet 3   No current facility-administered medications for this visit.     Allergies  Patient has no known allergies.  Electrocardiogram:  NSR rate 70 normal 12/21/16  SR rate 87 normal   Assessment and Plan  1. CAD:  Post stent to RCA in 2012 normal myovue 2013 no longer on DAT His symptoms are a bit Atypical but he is overly anxious about symptoms and has known disease Obesity increases risk  Of poor quality myovue. Discussed options and favor diagnostic cat Will arrange for next week Start lopressor/ imdur Refill SL nitro called in Labs ordered cath lab called for 7/3 with Dr Eldridge DaceVaranasi  2. HTN:  Well controlled.  Continue current medications and low sodium Dash type diet.   3. Chol:  Need labs from primary would like to decrease lipitor to 40  mg to minimize risk of long term side effects Lab Results  Component Value Date   LDLCALC 70 09/08/2010

## 2016-12-25 NOTE — Interval H&P Note (Signed)
Cath Lab Visit (complete for each Cath Lab visit)  Clinical Evaluation Leading to the Procedure:   ACS: No.  Non-ACS:    Anginal Classification: CCS II  Anti-ischemic medical therapy: Maximal Therapy (2 or more classes of medications)  Non-Invasive Test Results: No non-invasive testing performed  Prior CABG: No previous CABG  Hemorrhoidal bleeding    History and Physical Interval Note:  12/25/2016 10:05 AM  Wilma FlavinSamuel M Stockdale  has presented today for surgery, with the diagnosis of chest pain  The various methods of treatment have been discussed with the patient and family. After consideration of risks, benefits and other options for treatment, the patient has consented to  Procedure(s): Left Heart Cath and Coronary Angiography (N/A) as a surgical intervention .  The patient's history has been reviewed, patient examined, no change in status, stable for surgery.  I have reviewed the patient's chart and labs.  Questions were answered to the patient's satisfaction.     Lance MussJayadeep Jazmyne Beauchesne

## 2016-12-31 ENCOUNTER — Telehealth: Payer: Self-pay

## 2016-12-31 DIAGNOSIS — D649 Anemia, unspecified: Secondary | ICD-10-CM

## 2016-12-31 NOTE — Telephone Encounter (Signed)
-----   Message from Wendall StadePeter C Nishan, MD sent at 12/25/2016 11:02 AM EDT ----- Make sure he has f/u with [facility] for anemia needs colonoscopy  ----- Message ----- From: Corky CraftsVaranasi, Jayadeep S, MD Sent: 12/25/2016  11:01 AM To: Wendall StadePeter C Nishan, MD

## 2016-12-31 NOTE — Telephone Encounter (Signed)
Put in referral for Dr. Kinnie ScalesMedoff. Patient aware of lab results and will be following up with PCP as well.

## 2017-01-14 ENCOUNTER — Other Ambulatory Visit (HOSPITAL_COMMUNITY): Payer: Self-pay | Admitting: *Deleted

## 2017-01-15 ENCOUNTER — Ambulatory Visit (HOSPITAL_COMMUNITY)
Admission: RE | Admit: 2017-01-15 | Discharge: 2017-01-15 | Disposition: A | Discharge status: Home / Self Care | Payer: BLUE CROSS/BLUE SHIELD | Source: Ambulatory Visit | Attending: Internal Medicine | Admitting: Internal Medicine

## 2017-01-15 DIAGNOSIS — D649 Anemia, unspecified: Secondary | ICD-10-CM | POA: Diagnosis not present

## 2017-01-15 MED ORDER — SODIUM CHLORIDE 0.9 % IV SOLN
510.0000 mg | INTRAVENOUS | Status: DC
  Administered 2017-01-15: 11:00:00 510 mg via INTRAVENOUS
  Filled 2017-01-15: qty 17

## 2017-01-15 NOTE — Discharge Instructions (Signed)

## 2017-01-17 ENCOUNTER — Ambulatory Visit: Payer: BLUE CROSS/BLUE SHIELD | Admitting: Cardiology

## 2017-01-18 ENCOUNTER — Encounter (HOSPITAL_COMMUNITY): Payer: BLUE CROSS/BLUE SHIELD

## 2017-01-23 ENCOUNTER — Ambulatory Visit (HOSPITAL_COMMUNITY)
Admission: RE | Admit: 2017-01-23 | Discharge: 2017-01-23 | Disposition: A | Discharge status: Home / Self Care | Payer: BLUE CROSS/BLUE SHIELD | Source: Ambulatory Visit | Attending: Internal Medicine | Admitting: Internal Medicine

## 2017-01-23 DIAGNOSIS — D649 Anemia, unspecified: Secondary | ICD-10-CM | POA: Diagnosis not present

## 2017-01-23 MED ORDER — SODIUM CHLORIDE 0.9 % IV SOLN
510.0000 mg | INTRAVENOUS | Status: AC
  Administered 2017-01-23: 510 mg via INTRAVENOUS
  Filled 2017-01-23: qty 17

## 2017-02-05 ENCOUNTER — Ambulatory Visit (INDEPENDENT_AMBULATORY_CARE_PROVIDER_SITE_OTHER): Payer: BLUE CROSS/BLUE SHIELD | Admitting: Nurse Practitioner

## 2017-02-05 ENCOUNTER — Encounter: Payer: Self-pay | Admitting: Nurse Practitioner

## 2017-02-05 VITALS — BP 138/84 | HR 68 | Ht 71.0 in | Wt 279.2 lb

## 2017-02-05 DIAGNOSIS — I259 Chronic ischemic heart disease, unspecified: Secondary | ICD-10-CM | POA: Diagnosis not present

## 2017-02-05 DIAGNOSIS — Z9889 Other specified postprocedural states: Secondary | ICD-10-CM

## 2017-02-05 LAB — CBC
Hematocrit: 33.9 % — ABNORMAL LOW (ref 37.5–51.0)
Hemoglobin: 10.9 g/dL — ABNORMAL LOW (ref 13.0–17.7)
MCH: 24.1 pg — ABNORMAL LOW (ref 26.6–33.0)
MCHC: 32.2 g/dL (ref 31.5–35.7)
MCV: 75 fL — ABNORMAL LOW (ref 79–97)
Platelets: 335 10*3/uL (ref 150–379)
RBC: 4.52 x10E6/uL (ref 4.14–5.80)
RDW: 22.3 % — ABNORMAL HIGH (ref 12.3–15.4)
WBC: 6 10*3/uL (ref 3.4–10.8)

## 2017-02-05 LAB — BASIC METABOLIC PANEL
BUN/Creatinine Ratio: 10 (ref 9–20)
BUN: 10 mg/dL (ref 6–24)
CO2: 24 mmol/L (ref 20–29)
Calcium: 9.8 mg/dL (ref 8.7–10.2)
Chloride: 101 mmol/L (ref 96–106)
Creatinine, Ser: 0.97 mg/dL (ref 0.76–1.27)
GFR calc Af Amer: 100 mL/min/{1.73_m2} (ref >59)
GFR calc non Af Amer: 87 mL/min/{1.73_m2} (ref >59)
Glucose: 146 mg/dL — ABNORMAL HIGH (ref 65–99)
Potassium: 3.8 mmol/L (ref 3.5–5.2)
Sodium: 141 mmol/L (ref 134–144)

## 2017-02-05 NOTE — Patient Instructions (Addendum)
We will be checking the following labs today - BMET and CBC   Medication Instructions:    Continue with your current medicines.     Testing/Procedures To Be Arranged:  N/A  Follow-Up:   See Dr. Eden EmmsNishan in one year    Other Special Instructions:   Think about what we talked about today    If you need a refill on your cardiac medications before your next appointment, please call your pharmacy.   Call the [facility] office at [phone] if you have any questions, problems or concerns.

## 2017-02-05 NOTE — Progress Notes (Signed)
CARDIOLOGY OFFICE NOTE  Date:  02/05/2017    Micheal Chavez Date of Birth: 1961-03-01 Medical Record #[id number]  PCP:  Rodrigo RanPerini, Mark, MD  Cardiologist:  Eden EmmsNishan   Chief Complaint  Patient presents with  . Follow-up    Post cardiac cath visit - seen for Dr. Eden EmmsNishan    History of Present Illness: Micheal Chavez is a 56 y.o. male who presents today for a follow up visit. Seen for Dr. Eden EmmsNishan.   He has a history of hypertension, hyperlipidemia and borderline diabetes mellitus.   He presented to [facility] back in 2012 with chest discomfort consistent with unstable angina pectoris. He ruled out for myocardial infarction. Cardiac catheterization performed in the 06/29/10 demonstrated a 95% lesion in the RCA which was successfully treated with a drug-eluting stent. His P2Y12 was 0% on Plavix and he was switched Effient. Echocardiogram on 1/4 demonstrated an ejection fraction of 60-65%, mild LVH and mild left atrial enlargement. Had normal Myoview back in 2013.   Seen back in June after a 4 year absence - endorsing chest pain - cardiac cath was arranged. Noted to be markedly anemic at that time - now being worked up by GI. Aspirin dose cut back after recent cath.   Comes in today. Here alone. Doing ok. Had his colonoscopy yesterday - this turned out ok. Apparently this was all due to a bleeding hemorrhoid - he has been banded twice but apparently has not worked long term. Has had some saline placed and may be having repeat banding again. Has had 2 rounds of IV iron. Has not had his lab rechecked. Has stopped the metoprolol and Imdur that he was placed on prior to his cath.  No more chest pain. Not short of breath. We have reviewed the findings on his cath today.   Past Medical History:  Diagnosis Date  . Borderline diabetes mellitus   . Coronary artery disease    Status post drug-eluting stent to the RCA 06/29/10. --  cath 06/29/10: LAD 30%; Cfx 30-50%; EF 60-65%  . Dyslipidemia     . Glucose intolerance (impaired glucose tolerance)   . Hemorrhoids   . Hyperlipidemia   . Hypertension   . Obesity   . Previous back surgery   . Sleep apnea    Uses CPAP nightly    Past Surgical History:  Procedure Laterality Date  . BACK SURGERY     ruptured disc lower back  . CARDIAC CATHETERIZATION  06/29/2010   LAD 30%; Cfx 30-50%; EF 60-65%   . CHONDROPLASTY Right 03/14/2015   Procedure: PATELLO-FEMORAL, MEDIAL FEMORAL CONDYLE CHONDROPLASTY WITH MICROFRACTURE TECHNIQUE;  Surgeon: Jodi GeraldsJohn Graves, MD;  Location: [facility];  Service: Orthopedics;  Laterality: Right;  . CORONARY ANGIOPLASTY WITH STENT PLACEMENT  06/29/2010   Successful percutaneous transluminal coronary angioplasty  with placement of a drug-eluting stent in the proximal right coronary  artery  . EYE SURGERY Bilateral 2011,2013  . HEMORRHOID SURGERY     Status post hemorrhoid surgery x5  . KNEE ARTHROSCOPY WITH EXCISION PLICA Right 03/14/2015   Procedure: EXCISION MEDIAL PLICA;  Surgeon: Jodi GeraldsJohn Graves, MD;  Location: [facility];  Service: Orthopedics;  Laterality: Right;  . KNEE ARTHROSCOPY WITH MEDIAL MENISECTOMY Right 03/14/2015   Procedure: RIGHT KNEE ARTHROSCOPY WITH PARTIAL MEDIAL MENISECTOMY;  Surgeon: Jodi GeraldsJohn Graves, MD;  Location: [facility];  Service: Orthopedics;  Laterality: Right;  . LEFT HEART CATH AND CORONARY ANGIOGRAPHY N/A 12/25/2016  Procedure: Left Heart Cath and Coronary Angiography;  Surgeon: Corky Crafts, MD;  Location: [facility] INVASIVE CV LAB;  Service: Cardiovascular;  Laterality: N/A;     Medications: Current Meds  Medication Sig  . aspirin 81 MG chewable tablet Chew 1 tablet (81 mg total) by mouth before cath procedure.  Marland Kitchen atorvastatin (LIPITOR) 80 MG tablet Take 1 tablet (80 mg total) by mouth daily.  . bifidobacterium infantis (ALIGN) capsule Take 1 capsule by mouth daily.  . chlorthalidone (HYGROTON) 25 MG tablet Take 1 tablet by mouth daily.   . Coenzyme Q10 (CO Q 10 PO) Take 400 mg by mouth daily.  . irbesartan (AVAPRO) 300 MG tablet Take 300 mg by mouth daily.  . metFORMIN (GLUCOPHAGE) 1000 MG tablet Take 1,000 mg by mouth 2 (two) times daily with a meal.   . nitroGLYCERIN (NITROSTAT) 0.4 MG SL tablet Place 1 tablet (0.4 mg total) under the tongue every 5 (five) minutes as needed for chest pain (MAX 3 TABLETS).     Allergies: No Known Allergies  Social History: The patient  reports that he quit smoking about 14 years ago. His smoking use included Cigarettes. He has a 5.00 pack-year smoking history. He has never used smokeless tobacco. He reports that he drinks alcohol. He reports that he does not use drugs.   Family History: The patient's family history includes Heart attack in his father and mother.   Review of Systems: Please see the history of present illness.   Otherwise, the review of systems is positive for none.   All other systems are reviewed and negative.   Physical Exam: VS:  BP 138/84 (BP Location: Left Arm, Patient Position: Sitting, Cuff Size: Large)   Pulse 68   Ht 5\' 11"  (1.803 m)   Wt 279 lb 3.2 oz (126.6 kg)   SpO2 98% Comment: at rest  BMI 38.94 kg/m  .  BMI Body mass index is 38.94 kg/m.  Wt Readings from Last 3 Encounters:  02/05/17 279 lb 3.2 oz (126.6 kg)  01/23/17 280 lb (127 kg)  01/15/17 280 lb (127 kg)    General: Pleasant. Obese male. He is alert and in no acute distress.   HEENT: Normal.  Neck: Supple, no JVD, carotid bruits, or masses noted.  Cardiac: Regular rate and rhythm. No murmurs, rubs, or gallops. No edema.  Respiratory:  Lungs are clear to auscultation bilaterally with normal work of breathing.  GI: Soft and nontender.  MS: No deformity or atrophy. Gait and ROM intact.  Skin: Warm and dry. Color is normal.  Neuro:  Strength and sensation are intact and no gross focal deficits noted.  Psych: Alert, appropriate and with normal affect.   LABORATORY DATA:  EKG:  EKG is  not ordered today.  Lab Results  Component Value Date   WBC 7.1 12/25/2016   HGB 9.0 (L) 12/25/2016   HCT 31.8 (L) 12/25/2016   PLT 360 12/25/2016   GLUCOSE 170 (H) 12/21/2016   CHOL 116 09/08/2010   TRIG 70.0 09/08/2010   HDL 31.90 (L) 09/08/2010   LDLCALC 70 09/08/2010   ALT 73 (H) 09/08/2010   AST 38 (H) 09/08/2010   NA 139 12/21/2016   K 3.9 12/21/2016   CL 99 12/21/2016   CREATININE 0.84 12/21/2016   BUN 11 12/21/2016   CO2 26 12/21/2016   INR 0.9 12/21/2016   HGBA1C (H) 06/28/2010    6.2 (NOTE)  According to the ADA Clinical Practice Recommendations for 2011, when HbA1c is used as a screening test:   >=6.5%   Diagnostic of Diabetes Mellitus           (if abnormal result  is confirmed)  5.7-6.4%   Increased risk of developing Diabetes Mellitus  References:Diagnosis and Classification of Diabetes Mellitus,Diabetes Care,2011,34(Suppl 1):S62-S69 and Standards of Medical Care in         Diabetes - 2011,Diabetes Care,2011,34  (Suppl 1):S11-S61.        BNP (last 3 results) No results for input(s): BNP in the last 8760 hours.  ProBNP (last 3 results) No results for input(s): PROBNP in the last 8760 hours.   Other Studies Reviewed Today: Procedures   Left Heart Cath and Coronary Angiography 12/2016  Conclusion     Mid RCA lesion, 0 %stenosed.  RPDA lesion, 25 %stenosed.  Prox Cx to Mid Cx lesion, 50 %stenosed.  Mid LAD lesion, 30 %stenosed.  Dist LAD lesion, 30 %stenosed.  The left ventricular systolic function is normal.  LV end diastolic pressure is normal.  The left ventricular ejection fraction is 55-65% by visual estimate.  There is no aortic valve stenosis.   Continue aggressive secondary prevention.  Decrease aspirin to 81 mg daily.    He will continue with anemia w/u with Dr. Kinnie Scales.     Assessment/Plan:  1. CAD:  Post stent to RCA in 2012 normal Myovue 2013 - recurrent  chest pain - s/p recent cardiac cath - see above - to manage medically. These results were reviewed with him and emphasized the need for CV risk factor modification (discussed at length) and treat anemia.   2. HTN:  Well controlled.  No change with current regimen.    3. Chol:  last lipids from PCP back in June of 2017 noted - ALT was normal as well.  4. Anemia - evaluation/treatment underway - apparently all due to bleeding hemorrhoid.   5. Obesity - discussed need for weight loss.   6. DM - last A1C of 7 noted.    Current medicines are reviewed with the patient today.  The patient does not have concerns regarding medicines other than what has been noted above.  The following changes have been made:  See above.  Labs/ tests ordered today include:    Orders Placed This Encounter  Procedures  . Basic metabolic panel  . CBC     Disposition:   FU with Dr. Eden Emms in one year.    Patient is agreeable to this plan and will call if any problems develop in the interim.   SignedNorma Fredrickson, NP  02/05/2017 10:02 AM  [facility] [address] [place], [place]  [address] Phone: [phone] Fax: [phone]

## 2017-03-11 ENCOUNTER — Encounter (HOSPITAL_COMMUNITY): Payer: Self-pay

## 2017-03-11 ENCOUNTER — Emergency Department (HOSPITAL_COMMUNITY): Payer: BLUE CROSS/BLUE SHIELD

## 2017-03-11 ENCOUNTER — Emergency Department (HOSPITAL_COMMUNITY)
Admission: EM | Admit: 2017-03-11 | Discharge: 2017-03-11 | Disposition: A | Discharge status: Home / Self Care | Payer: BLUE CROSS/BLUE SHIELD | Attending: Emergency Medicine | Admitting: Emergency Medicine

## 2017-03-11 DIAGNOSIS — Z87891 Personal history of nicotine dependence: Secondary | ICD-10-CM | POA: Diagnosis not present

## 2017-03-11 DIAGNOSIS — I25119 Atherosclerotic heart disease of native coronary artery with unspecified angina pectoris: Secondary | ICD-10-CM | POA: Diagnosis not present

## 2017-03-11 DIAGNOSIS — I119 Hypertensive heart disease without heart failure: Secondary | ICD-10-CM | POA: Diagnosis not present

## 2017-03-11 DIAGNOSIS — M5417 Radiculopathy, lumbosacral region: Secondary | ICD-10-CM | POA: Diagnosis not present

## 2017-03-11 DIAGNOSIS — Z79899 Other long term (current) drug therapy: Secondary | ICD-10-CM | POA: Diagnosis not present

## 2017-03-11 DIAGNOSIS — M545 Low back pain: Secondary | ICD-10-CM | POA: Diagnosis present

## 2017-03-11 DIAGNOSIS — Z7984 Long term (current) use of oral hypoglycemic drugs: Secondary | ICD-10-CM | POA: Diagnosis not present

## 2017-03-11 MED ORDER — KETOROLAC TROMETHAMINE 15 MG/ML IJ SOLN
15.0000 mg | Freq: Once | INTRAMUSCULAR | Status: AC
  Administered 2017-03-11: 15 mg via INTRAVENOUS
  Filled 2017-03-11: qty 1

## 2017-03-11 MED ORDER — GABAPENTIN 100 MG PO CAPS: 100.0000 mg | ORAL_CAPSULE | Freq: Three times a day (TID) | ORAL | 0 refills | Status: DC

## 2017-03-11 MED ORDER — MORPHINE SULFATE (PF) 4 MG/ML IV SOLN
4.0000 mg | Freq: Once | INTRAVENOUS | Status: AC
  Administered 2017-03-11: 4 mg via INTRAMUSCULAR
  Filled 2017-03-11: qty 1

## 2017-03-11 NOTE — ED Triage Notes (Addendum)
Pt presents with ems for back pain, he was trying to lift something a week ago and has had pain ever since that is worsening. Pt has history of a slipped disk and states that this feels the same. No relief at home with heat and rest. reports numbness and tingling down right leg. Pain does radiate into right foot. Vs wnl. Alert and oriented.

## 2017-03-11 NOTE — Discharge Instructions (Addendum)
Your pain is likely due to herniated disc. Please  stop the Flexeril and start gabapentin you can take this 3 times a day. Please follow-up with your neurologist. I would continue Tylenol as discussed previously.  I would recommend following back exercises provided and handout.   Results of your MRI  1. L4-5 severe spinal stenosis due to congenitally narrow canal, central disc protrusion, and hypertrophic facets. 2. L5-S1 advanced disc degeneration with facet arthropathy. Previous right-sided laminotomy. No compressive stenosis. 3. Abnormally hypointense marrow which can be seen with anemia, chronic hypoxemia, obesity, or lymphoproliferative disease. Please correlate with CBC.

## 2017-03-11 NOTE — ED Provider Notes (Signed)
[facility]-EMERGENCY DEPT Provider Note   CSN: [id number] Arrival date & time: 03/11/17  1106     History   Chief Complaint Chief Complaint  Patient presents with  . Back Pain    HPI Micheal Chavez is a 56 y.o. male.  HPI  Patient presenting with lower back pain. States that about 2 weeks ago he was doing some yard work and had some slight pain. About 2 days later he picked up a bed and again had worsening of his pain. Patient states that the pain radiates down his left buttocks and into his heel. He noted some numbness in his heel. He states that he called his primary care physician on Friday and received Flexeril. States he's been taking Flexeril without any issue. However she continues to have pain at night and one on the Flexeril. Patient denies any urinary bowel incontinence. He denies any steroid use. He denies any IV drug use. He denies any fevers chills weight loss. Patient has no history of cancer. He states that he had some back pain about 24 years ago and at that time he had surgery on his back for a slipped disc.   Past Medical History:  Diagnosis Date  . Borderline diabetes mellitus   . Coronary artery disease    Status post drug-eluting stent to the RCA 06/29/10. --  cath 06/29/10: LAD 30%; Cfx 30-50%; EF 60-65%  . Dyslipidemia   . Glucose intolerance (impaired glucose tolerance)   . Hemorrhoids   . Hyperlipidemia   . Hypertension   . Obesity   . Previous back surgery   . Sleep apnea    Uses CPAP nightly    Patient Active Problem List   Diagnosis Date Noted  . Angina pectoris (HCC)   . GLUCOSE INTOLERANCE 07/14/2010  . HYPERLIPIDEMIA 07/14/2010  . HYPERTENSION 07/14/2010  . CORONARY ARTERY DISEASE 07/14/2010  . HEMORRHOIDS 07/14/2010  . CARDIAC MURMUR 07/14/2010    Past Surgical History:  Procedure Laterality Date  . BACK SURGERY     ruptured disc lower back  . CARDIAC CATHETERIZATION  06/29/2010   LAD 30%; Cfx 30-50%; EF 60-65%   . CHONDROPLASTY Right  03/14/2015   Procedure: PATELLO-FEMORAL, MEDIAL FEMORAL CONDYLE CHONDROPLASTY WITH MICROFRACTURE TECHNIQUE;  Surgeon: Jodi Geralds, MD;  Location: [facility];  Service: Orthopedics;  Laterality: Right;  . CORONARY ANGIOPLASTY WITH STENT PLACEMENT  06/29/2010   Successful percutaneous transluminal coronary angioplasty  with placement of a drug-eluting stent in the proximal right coronary  artery  . EYE SURGERY Bilateral 2011,2013  . HEMORRHOID SURGERY     Status post hemorrhoid surgery x5  . KNEE ARTHROSCOPY WITH EXCISION PLICA Right 03/14/2015   Procedure: EXCISION MEDIAL PLICA;  Surgeon: Jodi Geralds, MD;  Location: [facility];  Service: Orthopedics;  Laterality: Right;  . KNEE ARTHROSCOPY WITH MEDIAL MENISECTOMY Right 03/14/2015   Procedure: RIGHT KNEE ARTHROSCOPY WITH PARTIAL MEDIAL MENISECTOMY;  Surgeon: Jodi Geralds, MD;  Location: [facility];  Service: Orthopedics;  Laterality: Right;  . LEFT HEART CATH AND CORONARY ANGIOGRAPHY N/A 12/25/2016   Procedure: Left Heart Cath and Coronary Angiography;  Surgeon: Corky Crafts, MD;  Location: [facility] INVASIVE CV LAB;  Service: Cardiovascular;  Laterality: N/A;       Home Medications    Prior to Admission medications   Medication Sig Start Date End Date Taking? Authorizing Provider  aspirin 81 MG chewable tablet Chew 1 tablet (81 mg total) by mouth before cath procedure. 12/26/16  Yes Corky Crafts, MD  atorvastatin (LIPITOR) 80 MG tablet Take 1 tablet (80 mg total) by mouth daily. 07/20/11  Yes Wendall Stade, MD  chlorthalidone (HYGROTON) 25 MG tablet Take 1 tablet by mouth daily. 12/11/16  Yes [provider]  Coenzyme Q10 (CO Q 10 PO) Take 400 mg by mouth daily.   Yes [provider]  Cyanocobalamin (VITAMIN B-12 PO) Take 1 tablet by mouth daily.   Yes [provider]  irbesartan (AVAPRO) 300 MG tablet Take 300 mg by mouth daily.   Yes [provider]    metFORMIN (GLUCOPHAGE) 1000 MG tablet Take 1,000 mg by mouth 2 (two) times daily with a meal.    Yes [provider]  nitroGLYCERIN (NITROSTAT) 0.4 MG SL tablet Place 1 tablet (0.4 mg total) under the tongue every 5 (five) minutes as needed for chest pain (MAX 3 TABLETS). 12/21/16   Wendall Stade, MD    Family History Family History  Problem Relation Age of Onset  . Heart attack Mother   . Heart attack Father     Social History Social History  Substance Use Topics  . Smoking status: Former Smoker    Packs/day: 0.50    Years: 10.00    Types: Cigarettes    Quit date: 08/08/2002  . Smokeless tobacco: Never Used     Comment: quit 2004  . Alcohol use Yes     Comment: occasional     Allergies   Patient has no known allergies.   Review of Systems Review of Systems  All other systems reviewed and are negative.   Physical Exam Updated Vital Signs BP (!) 146/82 (BP Location: Left Arm)   Pulse 60   Temp 97.6 F (36.4 C) (Oral)   Resp 16   Wt 126.6 kg (279 lb)   SpO2 100%   BMI 38.91 kg/m   Physical Exam  Constitutional: He is oriented to person, place, and time. He appears well-developed and well-nourished.  HENT:  Head: Normocephalic and atraumatic.  Eyes: Pupils are equal, round, and reactive to light. Conjunctivae and EOM are normal.  Neck: Normal range of motion. Neck supple.  Cardiovascular: Normal rate and regular rhythm.   Pulmonary/Chest: Effort normal and breath sounds normal.  Abdominal: Soft. Bowel sounds are normal.  Musculoskeletal: Normal range of motion.  Sacral and lumbar tenderness, decreased range of motion due to pain in normal strength bilaterally in both lower extremities, 2+ pedal pulses. Normal sensation.  Neurological: He is alert and oriented to person, place, and time.  Skin: Skin is warm. Capillary refill takes less than 2 seconds.  Psychiatric: He has a normal mood and affect.     ED Treatments / Results  Labs (all labs  ordered are listed, but only abnormal results are displayed) Labs Reviewed - No data to display  EKG  EKG Interpretation None       Radiology No results found.  Procedures Procedures (including critical care time)  Medications Ordered in ED Medications  morphine 4 MG/ML injection 4 mg (4 mg Intramuscular Given 03/11/17 1204)  ketorolac (TORADOL) 15 MG/ML injection 15 mg (15 mg Intravenous Given 03/11/17 1203)     Initial Impression / Assessment and Plan / ED Course  I have reviewed the triage vital signs and the nursing notes.  Pertinent labs & imaging results that were available during my care of the patient were reviewed by me and considered in my medical decision making (see chart for details).  Patient presenting with back pain and radiculopathy. MRI significant for spinal stenosis. Pain has improved significantly following Toradol and morphine administration. Provided patient with gabapentin at discharge for neuropathic element. Patient to follow up with his neurologist and primary care physician.   Final Clinical Impressions(s) / ED Diagnoses   Final diagnoses:  Radiculopathy of lumbosacral region    New Prescriptions New Prescriptions   No medications on file     Berton Bon, MD 03/11/17 1548    Gerhard Munch, MD 03/12/17 [phone]

## 2017-04-30 ENCOUNTER — Encounter: Payer: Self-pay | Admitting: Neurology

## 2017-05-01 ENCOUNTER — Encounter: Payer: Self-pay | Admitting: Neurology

## 2017-05-02 ENCOUNTER — Ambulatory Visit: Payer: BLUE CROSS/BLUE SHIELD | Admitting: Neurology

## 2017-05-02 ENCOUNTER — Telehealth: Payer: Self-pay | Admitting: Neurology

## 2017-05-02 ENCOUNTER — Encounter (INDEPENDENT_AMBULATORY_CARE_PROVIDER_SITE_OTHER): Payer: Self-pay

## 2017-05-02 ENCOUNTER — Encounter: Payer: Self-pay | Admitting: Neurology

## 2017-05-02 VITALS — BP 144/84 | HR 73 | Ht 71.0 in | Wt 283.0 lb

## 2017-05-02 DIAGNOSIS — Z9989 Dependence on other enabling machines and devices: Secondary | ICD-10-CM

## 2017-05-02 DIAGNOSIS — E1140 Type 2 diabetes mellitus with diabetic neuropathy, unspecified: Secondary | ICD-10-CM | POA: Diagnosis not present

## 2017-05-02 DIAGNOSIS — G4733 Obstructive sleep apnea (adult) (pediatric): Secondary | ICD-10-CM | POA: Diagnosis not present

## 2017-05-02 DIAGNOSIS — E66812 Obesity, class 2: Secondary | ICD-10-CM

## 2017-05-02 NOTE — Patient Instructions (Signed)
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
## 2017-05-06 ENCOUNTER — Other Ambulatory Visit: Payer: Self-pay | Admitting: Neurology

## 2017-05-06 DIAGNOSIS — G4733 Obstructive sleep apnea (adult) (pediatric): Secondary | ICD-10-CM

## 2017-05-07 ENCOUNTER — Telehealth: Payer: Self-pay | Admitting: Neurology

## 2017-05-07 NOTE — Telephone Encounter (Signed)
Called Dr Assunta CurtisPereni's office and left a message with his nursing stating that we need Dr Assunta CurtisPereni's office visit that was prior to the patient's sleep study in dec 2012. His study was completed 05/31/2011. But in order to transfer care we are required to send over the office visit that indicated the patient need for a sleep study which is usually the office visit prior to sleep study. We currently have the sleep study and we have the office visit after the sleep study which was early 2013, but there is no office note on file with us that was dated prior to sleep study. I am hoping that Dr Venida JarvisPerni's office can help us with this. LVM with our phone number and fax number to my pod.

## 2017-06-10 ENCOUNTER — Institutional Professional Consult (permissible substitution): Payer: Self-pay | Admitting: Neurology

## 2017-08-01 ENCOUNTER — Ambulatory Visit: Payer: BLUE CROSS/BLUE SHIELD

## 2017-08-21 ENCOUNTER — Encounter: Payer: Self-pay | Admitting: Neurology

## 2017-08-22 ENCOUNTER — Ambulatory Visit: Payer: BLUE CROSS/BLUE SHIELD | Admitting: Neurology

## 2017-08-23 ENCOUNTER — Encounter: Payer: Self-pay | Admitting: Neurology

## 2017-08-23 ENCOUNTER — Ambulatory Visit (INDEPENDENT_AMBULATORY_CARE_PROVIDER_SITE_OTHER): Payer: No Typology Code available for payment source | Admitting: Nurse Practitioner

## 2017-08-23 ENCOUNTER — Encounter: Payer: Self-pay | Admitting: Nurse Practitioner

## 2017-08-23 ENCOUNTER — Encounter (INDEPENDENT_AMBULATORY_CARE_PROVIDER_SITE_OTHER): Payer: Self-pay

## 2017-08-23 DIAGNOSIS — G4733 Obstructive sleep apnea (adult) (pediatric): Secondary | ICD-10-CM | POA: Diagnosis not present

## 2017-08-23 DIAGNOSIS — Z9989 Dependence on other enabling machines and devices: Secondary | ICD-10-CM | POA: Diagnosis not present

## 2017-08-23 NOTE — Progress Notes (Signed)
I agree with the assessment and plan as directed by NP .The patient has been highly compliant with CPAP use and benefits from treatment. His Apnea is very well controlled.   His Mallampati and neck circumference may have changed with BMI changes, will add in next visit.   He is using an auto-titration CPAP and we will document his 95% pressure and mask type and size in the next visit ( if changed) .    Alleta Avery, MD

## 2017-08-23 NOTE — Patient Instructions (Signed)
CPAP compliance 100% Continue same settings Follow-up yearly  
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
## 2017-08-26 NOTE — Progress Notes (Signed)
I agree with the assessment and plan as directed by NP .The patient is known to me .   Griffin Dewilde, MD  

## 2017-08-26 NOTE — Progress Notes (Signed)
I agree with the assessment and plan as directed by NP .The patient is known to me .   Lamount Bankson, MD  

## 2018-04-17 NOTE — Progress Notes (Signed)
CARDIOLOGY OFFICE NOTE  Date:  04/21/2018    Micheal Chavez Date of Birth: 04-29-1961 Medical Record #[id number]  PCP:  Rodrigo Ran, MD  Cardiologist:  Eden Emms   No chief complaint on file.   History of Present Illness:  57 y.o. CAD, HTN, HLD, DM.  Stent to RCA January 2012. Plavix failure Rx with Effient. EF 60-65% .  June 2018 had SSCP in setting  Of anemia.  Cath 12/25/16 with patent RCA stent only 50% residual circumflex disease. Colonoscopy ok seen by Medoff. ? Anemia from hemorrhoidal bleed had banding but sub-optimal results. Discussed seeing Dr Byrd Hesselbach at [facility] to consider more definitive Rx. Discussed ED and fact he should not take Viagra Needs new nitro    Past Medical History:  Diagnosis Date  . Borderline diabetes mellitus   . Coronary artery disease    Status post drug-eluting stent to the RCA 06/29/10. --  cath 06/29/10: LAD 30%; Cfx 30-50%; EF 60-65%  . Diabetes mellitus without complication (HCC)   . Dyslipidemia   . Glucose intolerance (impaired glucose tolerance)   . Hemorrhoids   . Hyperlipidemia   . Hypertension   . Obesity   . Previous back surgery   . Sleep apnea    Uses CPAP nightly    Past Surgical History:  Procedure Laterality Date  . BACK SURGERY     ruptured disc lower back  . CARDIAC CATHETERIZATION  06/29/2010   LAD 30%; Cfx 30-50%; EF 60-65%   . CHONDROPLASTY Right 03/14/2015   Procedure: PATELLO-FEMORAL, MEDIAL FEMORAL CONDYLE CHONDROPLASTY WITH MICROFRACTURE TECHNIQUE;  Surgeon: Jodi Geralds, MD;  Location: [facility];  Service: Orthopedics;  Laterality: Right;  . CORONARY ANGIOPLASTY WITH STENT PLACEMENT  06/29/2010   Successful percutaneous transluminal coronary angioplasty  with placement of a drug-eluting stent in the proximal right coronary  artery  . EYE SURGERY Bilateral 2011,2013  . HEMORRHOID SURGERY     Status post hemorrhoid surgery x5  . KNEE ARTHROSCOPY WITH EXCISION PLICA Right 03/14/2015   Procedure:  EXCISION MEDIAL PLICA;  Surgeon: Jodi Geralds, MD;  Location: [facility];  Service: Orthopedics;  Laterality: Right;  . KNEE ARTHROSCOPY WITH MEDIAL MENISECTOMY Right 03/14/2015   Procedure: RIGHT KNEE ARTHROSCOPY WITH PARTIAL MEDIAL MENISECTOMY;  Surgeon: Jodi Geralds, MD;  Location:  SURGERY CENTER;  Service: Orthopedics;  Laterality: Right;  . LEFT HEART CATH AND CORONARY ANGIOGRAPHY N/A 12/25/2016   Procedure: Left Heart Cath and Coronary Angiography;  Surgeon: Corky Crafts, MD;  Location: [facility];  Service: Cardiovascular;  Laterality: N/A;     Medications: No outpatient medications have been marked as taking for the 04/21/18 encounter (Office Visit) with Wendall Stade, MD.     Allergies: No Known Allergies  Social History: The patient  reports that he quit smoking about 15 years ago. His smoking use included cigarettes. He has a 5.00 pack-year smoking history. He has never used smokeless tobacco. He reports that he drinks alcohol. He reports that he does not use drugs.   Family History: The patient's family history includes Heart attack in his father and mother.   Review of Systems: Please see the history of present illness.   Otherwise, the review of systems is positive for none.   All other systems are reviewed and negative.   Physical Exam: VS:  BP 138/78   Pulse 97   Ht 5\' 11"  (1.803 m)   Wt 276 lb 4 oz (125.3 kg)  SpO2 97%   BMI 38.53 kg/m  .  BMI Body mass index is 38.53 kg/m.  Wt Readings from Last 3 Encounters:  04/21/18 276 lb 4 oz (125.3 kg)  08/23/17 282 lb 3.2 oz (128 kg)  05/02/17 283 lb (128.4 kg)   Affect appropriate Obese  HEENT: normal Neck supple with no adenopathy JVP normal no bruits no thyromegaly Lungs clear with no wheezing and good diaphragmatic motion Heart:  S1/S2 no murmur, no rub, gallop or click PMI normal Abdomen: benighn, BS positve, no tenderness, no AAA no bruit.  No HSM or HJR Distal  pulses intact with no bruits No edema Neuro non-focal Skin warm and dry No muscular weakness    LABORATORY DATA:  EKG:  04/21/18  SR rate 88 normal ECG   Lab Results  Component Value Date   WBC 6.0 02/05/2017   HGB 10.9 (L) 02/05/2017   HCT 33.9 (L) 02/05/2017   PLT 335 02/05/2017   GLUCOSE 146 (H) 02/05/2017   CHOL 116 09/08/2010   TRIG 70.0 09/08/2010   HDL 31.90 (L) 09/08/2010   LDLCALC 70 09/08/2010   ALT 73 (H) 09/08/2010   AST 38 (H) 09/08/2010   NA 141 02/05/2017   K 3.8 02/05/2017   CL 101 02/05/2017   CREATININE 0.97 02/05/2017   BUN 10 02/05/2017   CO2 24 02/05/2017   INR 0.9 12/21/2016   HGBA1C (H) 06/28/2010    6.2 (NOTE)                                                                       According to the ADA Clinical Practice Recommendations for 2011, when HbA1c is used as a screening test:   >=6.5%   Diagnostic of Diabetes Mellitus           (if abnormal result  is confirmed)  5.7-6.4%   Increased risk of developing Diabetes Mellitus  References:Diagnosis and Classification of Diabetes Mellitus,Diabetes Care,2011,34(Suppl 1):S62-S69 and Standards of Medical Care in         Diabetes - 2011,Diabetes Care,2011,34  (Suppl 1):S11-S61.        BNP (last 3 results) No results for input(s): BNP in the last 8760 hours.  ProBNP (last 3 results) No results for input(s): PROBNP in the last 8760 hours.   Other Studies Reviewed Today: Procedures   Left Heart Cath and Coronary Angiography 12/2016  Conclusion     Mid RCA lesion, 0 %stenosed.  RPDA lesion, 25 %stenosed.  Prox Cx to Mid Cx lesion, 50 %stenosed.  Mid LAD lesion, 30 %stenosed.  Dist LAD lesion, 30 %stenosed.  The left ventricular systolic function is normal.  LV end diastolic pressure is normal.  The left ventricular ejection fraction is 55-65% by visual estimate.  There is no aortic valve stenosis.   Continue aggressive secondary prevention.  Decrease aspirin to 81 mg daily.      He will continue with anemia w/u with Dr. Kinnie Scales.     Assessment/Plan:  1. CAD:  Post stent to RCA in 2012 normal Myovue 2013 Stent patent by cath July 2018 with residual 50% circumflex disease continue medical Rx New nitro called in   2. HTN:  Well controlled.  No change with current regimen.  3. Chol:   on statin needs labs with primary   4. Anemia - Hemorid bleeding  failed multiple banding attempts Dr Kinnie Scales F/U general surgery   5. Obesity - discussed need for weight loss.   6. DM - Discussed low carb diet.  Target hemoglobin A1c is 6.5 or less.  Continue current medications.  7. ED: no viagra with CAD    Charlton Haws

## 2018-04-21 ENCOUNTER — Encounter: Payer: Self-pay | Admitting: Cardiovascular Disease

## 2018-04-21 ENCOUNTER — Ambulatory Visit (INDEPENDENT_AMBULATORY_CARE_PROVIDER_SITE_OTHER): Payer: Self-pay | Admitting: Cardiovascular Disease

## 2018-04-21 VITALS — BP 138/78 | HR 97 | Ht 71.0 in | Wt 276.2 lb

## 2018-04-21 DIAGNOSIS — E785 Hyperlipidemia, unspecified: Secondary | ICD-10-CM

## 2018-04-21 DIAGNOSIS — R0789 Other chest pain: Secondary | ICD-10-CM

## 2018-04-21 DIAGNOSIS — I10 Essential (primary) hypertension: Secondary | ICD-10-CM

## 2018-04-21 DIAGNOSIS — I2510 Atherosclerotic heart disease of native coronary artery without angina pectoris: Secondary | ICD-10-CM | POA: Diagnosis not present

## 2018-04-21 MED ORDER — NITROGLYCERIN 0.4 MG SL SUBL: 0.4000 mg | SUBLINGUAL_TABLET | SUBLINGUAL | 3 refills | Status: AC | PRN

## 2018-04-21 NOTE — Patient Instructions (Addendum)

## 2018-11-04 DIAGNOSIS — M542 Cervicalgia: Secondary | ICD-10-CM | POA: Diagnosis not present

## 2018-11-04 DIAGNOSIS — M48061 Spinal stenosis, lumbar region without neurogenic claudication: Secondary | ICD-10-CM | POA: Diagnosis not present

## 2018-11-04 DIAGNOSIS — I2510 Atherosclerotic heart disease of native coronary artery without angina pectoris: Secondary | ICD-10-CM | POA: Diagnosis not present

## 2018-11-04 DIAGNOSIS — E1169 Type 2 diabetes mellitus with other specified complication: Secondary | ICD-10-CM | POA: Diagnosis not present

## 2018-11-05 DIAGNOSIS — R7989 Other specified abnormal findings of blood chemistry: Secondary | ICD-10-CM | POA: Diagnosis not present

## 2018-11-05 DIAGNOSIS — D6489 Other specified anemias: Secondary | ICD-10-CM | POA: Diagnosis not present

## 2018-11-05 DIAGNOSIS — D649 Anemia, unspecified: Secondary | ICD-10-CM | POA: Diagnosis not present

## 2018-11-12 ENCOUNTER — Other Ambulatory Visit: Payer: Self-pay | Admitting: Internal Medicine

## 2018-11-12 ENCOUNTER — Other Ambulatory Visit: Payer: No Typology Code available for payment source

## 2018-11-12 DIAGNOSIS — M542 Cervicalgia: Secondary | ICD-10-CM

## 2018-11-18 ENCOUNTER — Ambulatory Visit
Admission: RE | Admit: 2018-11-18 | Discharge: 2018-11-18 | Disposition: A | Discharge status: Home / Self Care | Payer: BLUE CROSS/BLUE SHIELD | Source: Ambulatory Visit | Attending: Internal Medicine | Admitting: Internal Medicine

## 2018-11-18 ENCOUNTER — Other Ambulatory Visit: Payer: Self-pay

## 2018-11-18 DIAGNOSIS — M542 Cervicalgia: Secondary | ICD-10-CM

## 2018-11-18 DIAGNOSIS — M4802 Spinal stenosis, cervical region: Secondary | ICD-10-CM | POA: Diagnosis not present

## 2019-04-06 DIAGNOSIS — Z961 Presence of intraocular lens: Secondary | ICD-10-CM | POA: Diagnosis not present

## 2019-04-06 DIAGNOSIS — H43813 Vitreous degeneration, bilateral: Secondary | ICD-10-CM | POA: Diagnosis not present

## 2019-04-06 DIAGNOSIS — E119 Type 2 diabetes mellitus without complications: Secondary | ICD-10-CM | POA: Diagnosis not present

## 2019-04-06 DIAGNOSIS — H35433 Paving stone degeneration of retina, bilateral: Secondary | ICD-10-CM | POA: Diagnosis not present

## 2019-04-08 DIAGNOSIS — D649 Anemia, unspecified: Secondary | ICD-10-CM | POA: Diagnosis not present

## 2019-04-08 DIAGNOSIS — D6489 Other specified anemias: Secondary | ICD-10-CM | POA: Diagnosis not present

## 2019-04-08 DIAGNOSIS — Z23 Encounter for immunization: Secondary | ICD-10-CM | POA: Diagnosis not present

## 2019-04-08 DIAGNOSIS — E119 Type 2 diabetes mellitus without complications: Secondary | ICD-10-CM | POA: Diagnosis not present

## 2019-04-08 DIAGNOSIS — R7989 Other specified abnormal findings of blood chemistry: Secondary | ICD-10-CM | POA: Diagnosis not present

## 2019-04-09 DIAGNOSIS — E1169 Type 2 diabetes mellitus with other specified complication: Secondary | ICD-10-CM | POA: Diagnosis not present

## 2019-04-09 DIAGNOSIS — I2510 Atherosclerotic heart disease of native coronary artery without angina pectoris: Secondary | ICD-10-CM | POA: Diagnosis not present

## 2019-04-09 DIAGNOSIS — M5030 Other cervical disc degeneration, unspecified cervical region: Secondary | ICD-10-CM | POA: Diagnosis not present

## 2019-04-09 DIAGNOSIS — D649 Anemia, unspecified: Secondary | ICD-10-CM | POA: Diagnosis not present

## 2019-04-29 DIAGNOSIS — L988 Other specified disorders of the skin and subcutaneous tissue: Secondary | ICD-10-CM | POA: Diagnosis not present

## 2019-07-16 DIAGNOSIS — S46212A Strain of muscle, fascia and tendon of other parts of biceps, left arm, initial encounter: Secondary | ICD-10-CM | POA: Diagnosis not present

## 2019-07-16 DIAGNOSIS — M25522 Pain in left elbow: Secondary | ICD-10-CM | POA: Diagnosis not present

## 2019-07-22 ENCOUNTER — Other Ambulatory Visit: Payer: Self-pay | Admitting: Orthopedic Surgery

## 2019-07-22 DIAGNOSIS — M25522 Pain in left elbow: Secondary | ICD-10-CM

## 2019-07-27 ENCOUNTER — Other Ambulatory Visit: Payer: Self-pay | Admitting: Orthopedic Surgery

## 2019-07-29 ENCOUNTER — Ambulatory Visit
Admission: RE | Admit: 2019-07-29 | Discharge: 2019-07-29 | Disposition: A | Discharge status: Home / Self Care | Payer: BLUE CROSS/BLUE SHIELD | Source: Ambulatory Visit | Attending: Orthopedic Surgery | Admitting: Orthopedic Surgery

## 2019-07-29 ENCOUNTER — Other Ambulatory Visit: Payer: Self-pay

## 2019-07-29 DIAGNOSIS — M25522 Pain in left elbow: Secondary | ICD-10-CM

## 2019-07-29 DIAGNOSIS — S46212A Strain of muscle, fascia and tendon of other parts of biceps, left arm, initial encounter: Secondary | ICD-10-CM | POA: Diagnosis not present

## 2019-07-30 ENCOUNTER — Telehealth: Payer: Self-pay | Admitting: Cardiovascular Disease

## 2019-07-30 DIAGNOSIS — S46212A Strain of muscle, fascia and tendon of other parts of biceps, left arm, initial encounter: Secondary | ICD-10-CM | POA: Diagnosis not present

## 2019-07-30 NOTE — Telephone Encounter (Signed)
   Primary Cardiologist:Peter Eden Emms, MD  Chart reviewed as part of pre-operative protocol coverage. Patient has not been seen in our office since 03/2018. Therefore, he will require a follow-up visit in order to better assess preoperative cardiovascular risk.  Pre-op covering staff: - Please schedule appointment and call patient to inform them. - Please contact requesting surgeon's office via preferred method (i.e, phone, fax) to inform them of need for appointment prior to surgery.  If applicable, this message will also be routed to pharmacy pool and/or primary cardiologist for input on holding anticoagulant/antiplatelet agent as requested below so that this information is available at time of patient's appointment.   Corrin Parker, PA-C  07/30/2019, 1:39 PM

## 2019-07-30 NOTE — Telephone Encounter (Signed)
Pt is agreeable to appt for pre op clearance. Pt is scheduled to see Berton Bon, NP 08/06/19 @ 11:45. Pt request Thursday 08/06/19 appt as his surgery is scheduled for 08[place]07/19 [place]. Pt states he will come Thursday and stay over night for his surgery to be done on 08/07/19. Pt thanked me for the call and the help. I will send clearance note to Berton Bon, NP for upcoming appt. I will send note to surgeon office as FYI. I will remove from the pre op call back pool.

## 2019-07-30 NOTE — Telephone Encounter (Signed)
   [facility] Pre-operative Risk Assessment    Request for surgical clearance:  1. What type of surgery is being performed? Left distal bicep tendon repair  2. When is this surgery scheduled? 2/12  3. What type of clearance is required (medical clearance vs. Pharmacy clearance to hold med vs. Both)? both  4. Are there any medications that need to be held prior to surgery and how long? unknown  5. Practice name and name of physician performing surgery? [facility], Dr. Dorna Leitz  6. What is your office phone number: [phone]   7.   What is your office fax number: [phone]  8.   Anesthesia type (None, local, MAC, general) ? general   Selena Zobro 07/30/2019, 12:22 PM  _________________________________________________________________   (provider comments below)

## 2019-07-31 ENCOUNTER — Other Ambulatory Visit: Payer: Self-pay | Admitting: Orthopedic Surgery

## 2019-08-03 ENCOUNTER — Other Ambulatory Visit: Payer: Self-pay

## 2019-08-03 ENCOUNTER — Encounter (HOSPITAL_BASED_OUTPATIENT_CLINIC_OR_DEPARTMENT_OTHER): Payer: Self-pay | Admitting: Orthopedic Surgery

## 2019-08-04 ENCOUNTER — Inpatient Hospital Stay (HOSPITAL_COMMUNITY): Admission: RE | Admit: 2019-08-04 | Payer: BC Managed Care – PPO | Source: Ambulatory Visit

## 2019-08-05 ENCOUNTER — Other Ambulatory Visit: Payer: Self-pay | Admitting: Orthopedic Surgery

## 2019-08-05 NOTE — Progress Notes (Addendum)
Cardiology Office Note  Date: 08/06/2019   ID: Micheal Chavez, DOB December 04, 1960, MRN [id number]  PCP:  Crist Infante, MD  Cardiologist:  Jenkins Rouge, MD Electrophysiologist:  None   No chief complaint on file.   History of Present Illness: Micheal Chavez is a 59 y.o. male with history of DM, HLD, HTN, CAD, OSA on CPAP, previous stent RCA January 2012.  Previous EF of 60 to 65% June.  He previously failed Plavix therapy and started on Effient.  Patient only takes aspirin 81 mg now.  He has been holding his aspirin for 7 days preop.  2018 had SSCP in setting of anemia.  Cath July 2018 with patent RCA stent and only 50% residual circumflex disease.  History of anemia from hemorrhoidal bleed with banding and suboptimal results.  Patient has chronic anemia secondary to hemorrhoidal bleeding issues.  States he has occasional iron transfusions.  Patient had recent complete rupture of the distal biceps tendon just proximal to its insertion site on the radial tuberosity of the left arm.  He is scheduled for surgery with Dr. Earlean Shawl on August 07, 2019.  He requires preop evaluation prior to surgery.  He denies any recent progressive anginal or exertional symptoms, palpitations or arrhythmias, orthostatic symptoms, stroke or TIA-like symptoms, dyspeptic symptoms, claudication-like symptoms, DVT or PE-like symptoms, has mild chronic lower extremity edema on chlorthalidone.  Past Medical History:  Diagnosis Date  . Anemia   . Borderline diabetes mellitus   . Coronary artery disease    Status post drug-eluting stent to the RCA 06/29/10. --  cath 06/29/10: LAD 30%; Cfx 30-50%; EF 60-65%  . Diabetes mellitus without complication ([place])   . Dyslipidemia   . Glucose intolerance (impaired glucose tolerance)   . Hemorrhoids   . Hyperlipidemia   . Hypertension   . Obesity   . Previous back surgery   . Rupture of left distal biceps tendon   . Sleep apnea    Uses CPAP nightly    Past Surgical  History:  Procedure Laterality Date  . BACK SURGERY     ruptured disc lower back  . CARDIAC CATHETERIZATION  06/29/2010   LAD 30%; Cfx 30-50%; EF 60-65%   . CHONDROPLASTY Right 03/14/2015   Procedure: PATELLO-FEMORAL, MEDIAL FEMORAL CONDYLE CHONDROPLASTY WITH MICROFRACTURE TECHNIQUE;  Surgeon: Dorna Leitz, MD;  Location: [facility];  Service: Orthopedics;  Laterality: Right;  . CORONARY ANGIOPLASTY WITH STENT PLACEMENT  06/29/2010   Successful percutaneous transluminal coronary angioplasty  with placement of a drug-eluting stent in the proximal right coronary  artery  . EYE SURGERY Bilateral 2011,2013  . HEMORRHOID SURGERY     Status post hemorrhoid surgery x5  . KNEE ARTHROSCOPY WITH EXCISION PLICA Right 6/83/7290   Procedure: EXCISION MEDIAL PLICA;  Surgeon: Dorna Leitz, MD;  Location: [facility];  Service: Orthopedics;  Laterality: Right;  . KNEE ARTHROSCOPY WITH MEDIAL MENISECTOMY Right 03/14/2015   Procedure: RIGHT KNEE ARTHROSCOPY WITH PARTIAL MEDIAL MENISECTOMY;  Surgeon: Dorna Leitz, MD;  Location: [facility];  Service: Orthopedics;  Laterality: Right;  . LEFT HEART CATH AND CORONARY ANGIOGRAPHY N/A 12/25/2016   Procedure: Left Heart Cath and Coronary Angiography;  Surgeon: Jettie Booze, MD;  Location: [facility];  Service: Cardiovascular;  Laterality: N/A;    Current Outpatient Medications  Medication Sig Dispense Refill  . Ascorbic Acid (VITAMIN C) 1000 MG tablet Take 1,000 mg by mouth daily.    Marland Kitchen aspirin 81 MG  chewable tablet Chew 1 tablet (81 mg total) by mouth before cath procedure.    Marland Kitchen atorvastatin (LIPITOR) 80 MG tablet Take 1 tablet (80 mg total) by mouth daily. 30 tablet 6  . chlorthalidone (HYGROTON) 25 MG tablet Take 1 tablet by mouth daily.    . Coenzyme Q10 (CO Q 10 PO) Take 200 mg daily by mouth.     . Cyanocobalamin (VITAMIN B-12 PO) Take 1 tablet by mouth daily.    Marland Kitchen doxazosin (CARDURA) 2 MG tablet Take 2  mg by mouth daily.    . ferrous sulfate 325 (65 FE) MG tablet Take 325 mg by mouth 2 (two) times daily with a meal.    . irbesartan (AVAPRO) 300 MG tablet Take 300 mg by mouth daily.    . metFORMIN (GLUCOPHAGE) 1000 MG tablet Take 1,000 mg by mouth 2 (two) times daily with a meal.     . nitroGLYCERIN (NITROSTAT) 0.4 MG SL tablet Place 1 tablet (0.4 mg total) under the tongue every 5 (five) minutes as needed for chest pain (MAX 3 TABLETS). 25 tablet 3   No current facility-administered medications for this visit.   Allergies:  Patient has no known allergies.   Social History: The patient  reports that he quit smoking about 17 years ago. His smoking use included cigarettes. He has a 5.00 pack-year smoking history. He has never used smokeless tobacco. He reports current alcohol use. He reports that he does not use drugs.   Family History: The patient's family history includes Heart attack in his father and mother.   ROS:  Please see the history of present illness. Otherwise, complete review of systems is positive for none.  All other systems are reviewed and negative.   Physical Exam: VS:  BP 124/68   Pulse 65   Ht '5\' 11"'  (1.803 m)   Wt 267 lb (121.1 kg)   SpO2 98%   BMI 37.24 kg/m , BMI Body mass index is 37.24 kg/m.  Wt Readings from Last 3 Encounters:  08/06/19 267 lb (121.1 kg)  04/21/18 276 lb 4 oz (125.3 kg)  08/23/17 282 lb 3.2 oz (128 kg)    Physical Exam  Constitutional: He is oriented to person, place, and time. He appears well-developed and well-nourished. No distress.  HENT:  Head: Normocephalic and atraumatic.  Neck: No JVD present.  Cardiovascular: Normal rate, regular rhythm and intact distal pulses.  Murmur heard.  Low-pitched blowing systolic murmur is present with a grade of 2/6 at the upper right sternal border. Pulmonary/Chest: Effort normal and breath sounds normal. No respiratory distress. He has no wheezes. He has no rales.  Abdominal: Soft.    Musculoskeletal:        General: No edema. Normal range of motion.     Cervical back: Normal range of motion and neck supple.  Neurological: He is alert and oriented to person, place, and time.  Skin: Skin is warm and dry.  Psychiatric: He has a normal mood and affect. His behavior is normal. Judgment and thought content normal.  Vitals reviewed.  ECG:  An ECG dated 08/06/2019 was personally reviewed today and demonstrated:  NSR rate of 65 .No acute St or T wave changes  Recent Labwork: No results found for requested labs within last 8760 hours.     Component Value Date/Time   CHOL 116 09/08/2010 1203   TRIG 70.0 09/08/2010 1203   HDL 31.90 (L) 09/08/2010 1203   CHOLHDL 4 09/08/2010 1203   VLDL 14.0  09/08/2010 1203   Ness 70 09/08/2010 1203    Other Studies Reviewed Today:  Left Heart Cath and Coronary Angiography 12/2016  Conclusion     Mid RCA lesion, 0 %stenosed.  RPDA lesion, 25 %stenosed.  Prox Cx to Mid Cx lesion, 50 %stenosed.  Mid LAD lesion, 30 %stenosed.  Dist LAD lesion, 30 %stenosed.  The left ventricular systolic function is normal.  LV end diastolic pressure is normal.  The left ventricular ejection fraction is 55-65% by visual estimate.  There is no aortic valve stenosis.  Continue aggressive secondary prevention. Decrease aspirin to 81 mg daily.   He will continue with anemia w/u with Dr. Earlean Shawl    Assessment and Plan:  1. Preoperative clearance   2. Coronary artery disease involving native coronary artery of native heart without angina pectoris   3. Essential hypertension   4. Hyperlipidemia, unspecified hyperlipidemia type   5. Type 1 diabetes mellitus with complications ([place])    1. Preoperative clearance Patient is here for preop clearance for surgical repair biceps tendon rupture by Dr. Berenice Primas. Surgeries scheduled for August 07, 2019.  Patient has RCRI score of 0.9%.Marland Kitchen  He has a Duke activity index score of 8.97 METS.  He  would be considered low risk from a cardiac standpoint for surgery under general anesthesia.  No further cardiac testing needed get a CBC and basic metabolic panel today.  Regarding ASA therapy, we recommend continuation of ASA throughout the perioperative period.  However, if the surgeon feels that cessation of ASA is required in the perioperative period, it may be stopped 5-7 days prior to surgery with a plan to resume it as soon as felt to be feasible from a surgical standpoint in the post-operative period.  2. Coronary artery disease involving native coronary artery of native heart without angina pectoris History of coronary artery disease with stent to RCA in January 2012.Cath July 2018 with patent RCA stent and only 50% residual circumflex disease.. Patient denies any current anginal or exertional symptoms.  Protocol suggest not holding aspirin during surgery.  However patient has been holding his aspirin approximately 7 days prior to surgery. Continue nitroglycerin sublingual as needed.  Continue atorvastatin 80 mg daily.  3. Essential hypertension Blood pressure 124/68 today.  Continue chlorthalidone 25 mg daily, doxazosin 2 mg daily, irbesartan 300 mg daily  4. Hyperlipidemia, unspecified hyperlipidemia type Patient taking atorvastatin 80 mg.  Get lipid profile today.  Continue atorvastatin 80 mg daily  5.Type 2 Diabetes Management by PCP.  Continue Metformin 1000 mg p.o. twice daily.  Patient will be getting a be met today.  We will check his random glucose when results are received.    Medication Adjustments/Labs and Tests Ordered: Current medicines are reviewed at length with the patient today.  Concerns regarding medicines are outlined above.    Patient Instructions  Medication Instructions:   Your physician recommends that you continue on your current medications as directed. Please refer to the Current Medication list given to you today.  *If you need a refill on your cardiac  medications before your next appointment, please call your pharmacy*  Lab Work: Bmp, Lipids & Cbc  If you have labs (blood work) drawn today and your tests are completely normal, you will receive your results only by: Marland Kitchen MyChart Message (if you have MyChart) OR . A paper copy in the mail If you have any lab test that is abnormal or we need to change your treatment, we will call you to review  the results.  Testing/Procedures: None ordered   Follow-Up: At [facility], you and your health needs are our priority.  As part of our continuing mission to provide you with exceptional heart care, we have created designated Provider Care Teams.  These Care Teams include your primary Cardiologist (physician) and Advanced Practice Providers (APPs -  Physician Assistants and Nurse Practitioners) who all work together to provide you with the care you need, when you need it.  Your next appointment:   1 year(s)  The format for your next appointment:   In Person  Provider:   You may see Jenkins Rouge, MD or one of the following Advanced Practice Providers on your designated Care Team:    Truitt Merle, NP  Cecilie Kicks, NP  Kathyrn Drown, NP   Other Instructions None          Signed, Levell July, NP 08/06/2019 1:04 PM    [facility] [place], [place], [place] [address] Phone: [phone]; Fax: [phone]

## 2019-08-06 ENCOUNTER — Ambulatory Visit (INDEPENDENT_AMBULATORY_CARE_PROVIDER_SITE_OTHER): Payer: BC Managed Care – PPO | Admitting: Family Medicine

## 2019-08-06 ENCOUNTER — Encounter: Payer: Self-pay | Admitting: Cardiology

## 2019-08-06 ENCOUNTER — Other Ambulatory Visit: Payer: Self-pay

## 2019-08-06 ENCOUNTER — Other Ambulatory Visit (HOSPITAL_COMMUNITY)
Admission: RE | Admit: 2019-08-06 | Discharge: 2019-08-06 | Disposition: A | Discharge status: Home / Self Care | Payer: BC Managed Care – PPO | Source: Ambulatory Visit | Attending: Orthopedic Surgery | Admitting: Orthopedic Surgery

## 2019-08-06 VITALS — BP 124/68 | HR 65 | Ht 71.0 in | Wt 267.0 lb

## 2019-08-06 DIAGNOSIS — Z01818 Encounter for other preprocedural examination: Secondary | ICD-10-CM | POA: Diagnosis not present

## 2019-08-06 DIAGNOSIS — Z20822 Contact with and (suspected) exposure to covid-19: Secondary | ICD-10-CM | POA: Diagnosis not present

## 2019-08-06 DIAGNOSIS — E785 Hyperlipidemia, unspecified: Secondary | ICD-10-CM | POA: Diagnosis not present

## 2019-08-06 DIAGNOSIS — I10 Essential (primary) hypertension: Secondary | ICD-10-CM

## 2019-08-06 DIAGNOSIS — I2510 Atherosclerotic heart disease of native coronary artery without angina pectoris: Secondary | ICD-10-CM | POA: Diagnosis not present

## 2019-08-06 DIAGNOSIS — E119 Type 2 diabetes mellitus without complications: Secondary | ICD-10-CM

## 2019-08-06 DIAGNOSIS — Z01812 Encounter for preprocedural laboratory examination: Secondary | ICD-10-CM | POA: Insufficient documentation

## 2019-08-06 LAB — SARS CORONAVIRUS 2 (TAT 6-24 HRS): SARS Coronavirus 2: NEGATIVE

## 2019-08-06 NOTE — Patient Instructions (Addendum)
Medication Instructions:   Your physician recommends that you continue on your current medications as directed. Please refer to the Current Medication list given to you today.  *If you need a refill on your cardiac medications before your next appointment, please call your pharmacy*  Lab Work: Bmp, Lipids & Cbc  If you have labs (blood work) drawn today and your tests are completely normal, you will receive your results only by: Marland Kitchen MyChart Message (if you have MyChart) OR . A paper copy in the mail If you have any lab test that is abnormal or we need to change your treatment, we will call you to review the results.  Testing/Procedures: None ordered   Follow-Up: At [facility], you and your health needs are our priority.  As part of our continuing mission to provide you with exceptional heart care, we have created designated Provider Care Teams.  These Care Teams include your primary Cardiologist (physician) and Advanced Practice Providers (APPs -  Physician Assistants and Nurse Practitioners) who all work together to provide you with the care you need, when you need it.  Your next appointment:   1 year(s)  The format for your next appointment:   In Person  Provider:   You may see Charlton Haws, MD or one of the following Advanced Practice Providers on your designated Care Team:    Norma Fredrickson, NP  Nada Boozer, NP  Georgie Chard, NP   Other Instructions None

## 2019-08-06 NOTE — H&P (Signed)
A pre op hand p   Chief Complaint: left elbow pain and weakness  HPI: Micheal Chavez is a 59 y.o. male who presents for evaluation of left elbow pain and weakness. It has been present for almost 4 weeks and has been worsening. He has failed conservative measures. Pain is rated as moderate.  Past Medical History:  Diagnosis Date  . Anemia   . Borderline diabetes mellitus   . Coronary artery disease    Status post drug-eluting stent to the RCA 06/29/10. --  cath 06/29/10: LAD 30%; Cfx 30-50%; EF 60-65%  . Diabetes mellitus without complication ([place])   . Dyslipidemia   . Glucose intolerance (impaired glucose tolerance)   . Hemorrhoids   . Hyperlipidemia   . Hypertension   . Obesity   . Previous back surgery   . Rupture of left distal biceps tendon   . Sleep apnea    Uses CPAP nightly   Past Surgical History:  Procedure Laterality Date  . BACK SURGERY     ruptured disc lower back  . CARDIAC CATHETERIZATION  06/29/2010   LAD 30%; Cfx 30-50%; EF 60-65%   . CHONDROPLASTY Right 03/14/2015   Procedure: PATELLO-FEMORAL, MEDIAL FEMORAL CONDYLE CHONDROPLASTY WITH MICROFRACTURE TECHNIQUE;  Surgeon: Dorna Leitz, MD;  Location: [facility];  Service: Orthopedics;  Laterality: Right;  . CORONARY ANGIOPLASTY WITH STENT PLACEMENT  06/29/2010   Successful percutaneous transluminal coronary angioplasty  with placement of a drug-eluting stent in the proximal right coronary  artery  . EYE SURGERY Bilateral 2011,2013  . HEMORRHOID SURGERY     Status post hemorrhoid surgery x5  . KNEE ARTHROSCOPY WITH EXCISION PLICA Right 01/02/6268   Procedure: EXCISION MEDIAL PLICA;  Surgeon: Dorna Leitz, MD;  Location: [facility];  Service: Orthopedics;  Laterality: Right;  . KNEE ARTHROSCOPY WITH MEDIAL MENISECTOMY Right 03/14/2015   Procedure: RIGHT KNEE ARTHROSCOPY WITH PARTIAL MEDIAL MENISECTOMY;  Surgeon: Dorna Leitz, MD;  Location: [facility];  Service: Orthopedics;   Laterality: Right;  . LEFT HEART CATH AND CORONARY ANGIOGRAPHY N/A 12/25/2016   Procedure: Left Heart Cath and Coronary Angiography;  Surgeon: Jettie Booze, MD;  Location: [facility];  Service: Cardiovascular;  Laterality: N/A;   Social History   Socioeconomic History  . Marital status: Married    Spouse name: Not on file  . Number of children: Not on file  . Years of education: Not on file  . Highest education level: Not on file  Occupational History  . Not on file  Tobacco Use  . Smoking status: Former Smoker    Packs/day: 0.50    Years: 10.00    Pack years: 5.00    Types: Cigarettes    Quit date: 08/08/2002    Years since quitting: 17.0  . Smokeless tobacco: Never Used  . Tobacco comment: quit 2004  Substance and Sexual Activity  . Alcohol use: Yes    Comment: occasional  . Drug use: No  . Sexual activity: Not on file  Other Topics Concern  . Not on file  Social History Narrative  . Not on file   Social Determinants of Health   Financial Resource Strain:   . Difficulty of Paying Living Expenses: Not on file  Food Insecurity:   . Worried About Charity fundraiser in the Last Year: Not on file  . Ran Out of Food in the Last Year: Not on file  Transportation Needs:   . Lack of Transportation (  Medical): Not on file  . Lack of Transportation (Non-Medical): Not on file  Physical Activity:   . Days of Exercise per Week: Not on file  . Minutes of Exercise per Session: Not on file  Stress:   . Feeling of Stress : Not on file  Social Connections:   . Frequency of Communication with Friends and Family: Not on file  . Frequency of Social Gatherings with Friends and Family: Not on file  . Attends Religious Services: Not on file  . Active Member of Clubs or Organizations: Not on file  . Attends Banker Meetings: Not on file  . Marital Status: Not on file   Family History  Problem Relation Age of Onset  . Heart attack Mother   . Heart attack  Father    No Known Allergies Prior to Admission medications   Medication Sig Start Date End Date Taking? Authorizing Provider  Ascorbic Acid (VITAMIN C) 1000 MG tablet Take 1,000 mg by mouth daily.   Yes [provider]  aspirin 81 MG chewable tablet Chew 1 tablet (81 mg total) by mouth before cath procedure. 12/26/16  Yes Corky Crafts, MD  atorvastatin (LIPITOR) 80 MG tablet Take 1 tablet (80 mg total) by mouth daily. 07/20/11  Yes Wendall Stade, MD  chlorthalidone (HYGROTON) 25 MG tablet Take 1 tablet by mouth daily. 12/11/16  Yes [provider]  Coenzyme Q10 (CO Q 10 PO) Take 200 mg daily by mouth.    Yes [provider]  Cyanocobalamin (VITAMIN B-12 PO) Take 1 tablet by mouth daily.   Yes [provider]  doxazosin (CARDURA) 2 MG tablet Take 2 mg by mouth daily.   Yes [provider]  ferrous sulfate 325 (65 FE) MG tablet Take 325 mg by mouth 2 (two) times daily with a meal.   Yes [provider]  irbesartan (AVAPRO) 300 MG tablet Take 300 mg by mouth daily.   Yes [provider]  metFORMIN (GLUCOPHAGE) 1000 MG tablet Take 1,000 mg by mouth 2 (two) times daily with a meal.    Yes [provider]  nitroGLYCERIN (NITROSTAT) 0.4 MG SL tablet Place 1 tablet (0.4 mg total) under the tongue every 5 (five) minutes as needed for chest pain (MAX 3 TABLETS). 04/21/18   Wendall Stade, MD     Positive ROS: none  All other systems have been reviewed and were otherwise negative with the exception of those mentioned in the HPI and as above.  Physical Exam: There were no vitals filed for this visit.  General: Alert, no acute distress Cardiovascular: No pedal edema Respiratory: No cyanosis, no use of accessory musculature GI: No organomegaly, abdomen is soft and non-tender Skin: No lesions in the area of chief complaint Neurologic: Sensation intact distally Psychiatric: Patient is competent for consent with normal  mood and affect Lymphatic: No axillary or cervical lymphadenopathy  MUSCULOSKELETAL: left elbow: Weakness in flexion and supination.  Negative hook testI: MRI shows distal biceps tendon rupture with retraction of tendon  Assessment/Plan: LEFT DISTAL BICEPS TENDON RUPTURE Plan for Procedure(s): LEFT DISTAL BICEPS TENDON REPAIR  The risks benefits and alternatives were discussed with the patient including but not limited to the risks of nonoperative treatment, versus surgical intervention including infection, bleeding, nerve injury, malunion, nonunion, hardware prominence, hardware failure, need for hardware removal, blood clots, cardiopulmonary complications, morbidity, mortality, among others, and they were willing to proceed.  Predicted outcome is good, although there will be at least  a six to nine month expected recovery.  Harvie Junior, MD 08/06/2019 8:12 PM

## 2019-08-06 NOTE — Progress Notes (Signed)
Pt had blood work and EKG done at MD office a few moments ago. Arrived here for his ERAS (G2) drink. Drink given with instruction to complete drink by 0900 on DOS. Pt verbalized understanding.

## 2019-08-07 ENCOUNTER — Ambulatory Visit (HOSPITAL_BASED_OUTPATIENT_CLINIC_OR_DEPARTMENT_OTHER): Payer: BC Managed Care – PPO | Admitting: Anesthesiology

## 2019-08-07 ENCOUNTER — Encounter (HOSPITAL_BASED_OUTPATIENT_CLINIC_OR_DEPARTMENT_OTHER)
Admission: RE | Disposition: A | Discharge status: Home / Self Care | Payer: Self-pay | Source: Home / Self Care | Attending: Orthopedic Surgery

## 2019-08-07 ENCOUNTER — Encounter (HOSPITAL_BASED_OUTPATIENT_CLINIC_OR_DEPARTMENT_OTHER): Payer: Self-pay | Admitting: Orthopedic Surgery

## 2019-08-07 ENCOUNTER — Ambulatory Visit (HOSPITAL_BASED_OUTPATIENT_CLINIC_OR_DEPARTMENT_OTHER)
Admission: RE | Admit: 2019-08-07 | Discharge: 2019-08-07 | Disposition: A | Discharge status: Home / Self Care | Payer: BC Managed Care – PPO | Attending: Orthopedic Surgery | Admitting: Orthopedic Surgery

## 2019-08-07 ENCOUNTER — Other Ambulatory Visit: Payer: Self-pay

## 2019-08-07 ENCOUNTER — Other Ambulatory Visit: Payer: Self-pay | Admitting: Orthopedic Surgery

## 2019-08-07 DIAGNOSIS — E785 Hyperlipidemia, unspecified: Secondary | ICD-10-CM | POA: Diagnosis not present

## 2019-08-07 DIAGNOSIS — Z6837 Body mass index (BMI) 37.0-37.9, adult: Secondary | ICD-10-CM | POA: Diagnosis not present

## 2019-08-07 DIAGNOSIS — Z8249 Family history of ischemic heart disease and other diseases of the circulatory system: Secondary | ICD-10-CM | POA: Insufficient documentation

## 2019-08-07 DIAGNOSIS — Z79899 Other long term (current) drug therapy: Secondary | ICD-10-CM | POA: Insufficient documentation

## 2019-08-07 DIAGNOSIS — Z7982 Long term (current) use of aspirin: Secondary | ICD-10-CM | POA: Diagnosis not present

## 2019-08-07 DIAGNOSIS — Z7984 Long term (current) use of oral hypoglycemic drugs: Secondary | ICD-10-CM | POA: Diagnosis not present

## 2019-08-07 DIAGNOSIS — D649 Anemia, unspecified: Secondary | ICD-10-CM | POA: Diagnosis not present

## 2019-08-07 DIAGNOSIS — E669 Obesity, unspecified: Secondary | ICD-10-CM | POA: Diagnosis not present

## 2019-08-07 DIAGNOSIS — Z87891 Personal history of nicotine dependence: Secondary | ICD-10-CM | POA: Diagnosis not present

## 2019-08-07 DIAGNOSIS — S46212A Strain of muscle, fascia and tendon of other parts of biceps, left arm, initial encounter: Secondary | ICD-10-CM

## 2019-08-07 DIAGNOSIS — E119 Type 2 diabetes mellitus without complications: Secondary | ICD-10-CM | POA: Insufficient documentation

## 2019-08-07 DIAGNOSIS — I10 Essential (primary) hypertension: Secondary | ICD-10-CM | POA: Insufficient documentation

## 2019-08-07 DIAGNOSIS — G8918 Other acute postprocedural pain: Secondary | ICD-10-CM | POA: Diagnosis not present

## 2019-08-07 DIAGNOSIS — Z955 Presence of coronary angioplasty implant and graft: Secondary | ICD-10-CM | POA: Diagnosis not present

## 2019-08-07 DIAGNOSIS — I2510 Atherosclerotic heart disease of native coronary artery without angina pectoris: Secondary | ICD-10-CM | POA: Insufficient documentation

## 2019-08-07 DIAGNOSIS — X500XXA Overexertion from strenuous movement or load, initial encounter: Secondary | ICD-10-CM | POA: Insufficient documentation

## 2019-08-07 DIAGNOSIS — G4730 Sleep apnea, unspecified: Secondary | ICD-10-CM | POA: Insufficient documentation

## 2019-08-07 HISTORY — DX: Anemia, unspecified: D64.9

## 2019-08-07 HISTORY — DX: Strain of muscle, fascia and tendon of other parts of biceps, left arm, initial encounter: S46.212A

## 2019-08-07 HISTORY — PX: DISTAL BICEPS TENDON REPAIR: SHX1461

## 2019-08-07 LAB — BASIC METABOLIC PANEL
BUN/Creatinine Ratio: 11 (ref 9–20)
BUN: 11 mg/dL (ref 6–24)
CO2: 25 mmol/L (ref 20–29)
Calcium: 10.2 mg/dL (ref 8.7–10.2)
Chloride: 99 mmol/L (ref 96–106)
Creatinine, Ser: 1.01 mg/dL (ref 0.76–1.27)
GFR calc Af Amer: 94 mL/min/{1.73_m2} (ref >59)
GFR calc non Af Amer: 81 mL/min/{1.73_m2} (ref >59)
Glucose: 115 mg/dL — ABNORMAL HIGH (ref 65–99)
Potassium: 4 mmol/L (ref 3.5–5.2)
Sodium: 139 mmol/L (ref 134–144)

## 2019-08-07 LAB — CBC
Hematocrit: 40.2 % (ref 37.5–51.0)
Hemoglobin: 13.5 g/dL (ref 13.0–17.7)
MCH: 29.2 pg (ref 26.6–33.0)
MCHC: 33.6 g/dL (ref 31.5–35.7)
MCV: 87 fL (ref 79–97)
Platelets: 340 10*3/uL (ref 150–450)
RBC: 4.63 x10E6/uL (ref 4.14–5.80)
RDW: 13.4 % (ref 11.6–15.4)
WBC: 9.6 10*3/uL (ref 3.4–10.8)

## 2019-08-07 LAB — LIPID PANEL
Chol/HDL Ratio: 3.2 ratio (ref 0.0–5.0)
Cholesterol, Total: 115 mg/dL (ref 100–199)
HDL: 36 mg/dL — ABNORMAL LOW (ref >39)
LDL Chol Calc (NIH): 58 mg/dL (ref 0–99)
Triglycerides: 113 mg/dL (ref 0–149)
VLDL Cholesterol Cal: 21 mg/dL (ref 5–40)

## 2019-08-07 SURGERY — REPAIR, TENDON, BICEPS, DISTAL
Anesthesia: General | Site: Arm Upper | Laterality: Left

## 2019-08-07 MED ORDER — ROPIVACAINE HCL 5 MG/ML IJ SOLN
INTRAMUSCULAR | Status: DC | PRN
  Administered 2019-08-07: 30 mL via PERINEURAL

## 2019-08-07 MED ORDER — FENTANYL CITRATE (PF) 100 MCG/2ML IJ SOLN
INTRAMUSCULAR | Status: AC
  Filled 2019-08-07: qty 2

## 2019-08-07 MED ORDER — ACETAMINOPHEN 10 MG/ML IV SOLN
INTRAVENOUS | Status: AC
  Filled 2019-08-07: qty 100

## 2019-08-07 MED ORDER — HYDROMORPHONE HCL 1 MG/ML IJ SOLN
INTRAMUSCULAR | Status: AC
  Filled 2019-08-07: qty 0.5

## 2019-08-07 MED ORDER — MIDAZOLAM HCL 2 MG/2ML IJ SOLN
INTRAMUSCULAR | Status: AC
  Filled 2019-08-07: qty 2

## 2019-08-07 MED ORDER — ACETAMINOPHEN 10 MG/ML IV SOLN
1000.0000 mg | Freq: Four times a day (QID) | INTRAVENOUS | Status: DC
  Administered 2019-08-07: 1000 mg via INTRAVENOUS

## 2019-08-07 MED ORDER — MIDAZOLAM HCL 5 MG/5ML IJ SOLN
INTRAMUSCULAR | Status: DC | PRN
  Administered 2019-08-07: 2 mg via INTRAVENOUS

## 2019-08-07 MED ORDER — KETOROLAC TROMETHAMINE 30 MG/ML IJ SOLN
30.0000 mg | Freq: Once | INTRAMUSCULAR | Status: AC
  Administered 2019-08-07: 17:00:00 30 mg via INTRAVENOUS

## 2019-08-07 MED ORDER — LIDOCAINE 2% (20 MG/ML) 5 ML SYRINGE
INTRAMUSCULAR | Status: DC | PRN
  Administered 2019-08-07: 100 mg via INTRAVENOUS

## 2019-08-07 MED ORDER — PROPOFOL 10 MG/ML IV BOLUS
INTRAVENOUS | Status: AC
  Filled 2019-08-07: qty 20

## 2019-08-07 MED ORDER — ONDANSETRON HCL 4 MG/2ML IJ SOLN
INTRAMUSCULAR | Status: AC
  Filled 2019-08-07: qty 2

## 2019-08-07 MED ORDER — ONDANSETRON HCL 4 MG/2ML IJ SOLN
INTRAMUSCULAR | Status: DC | PRN
  Administered 2019-08-07: 4 mg via INTRAVENOUS

## 2019-08-07 MED ORDER — LACTATED RINGERS IV SOLN: INTRAVENOUS | Status: DC

## 2019-08-07 MED ORDER — LIDOCAINE 2% (20 MG/ML) 5 ML SYRINGE
INTRAMUSCULAR | Status: AC
  Filled 2019-08-07: qty 5

## 2019-08-07 MED ORDER — CLONIDINE HCL (ANALGESIA) 100 MCG/ML EP SOLN
EPIDURAL | Status: DC | PRN
  Administered 2019-08-07: 100 ug

## 2019-08-07 MED ORDER — CEFAZOLIN SODIUM-DEXTROSE 2-4 GM/100ML-% IV SOLN
INTRAVENOUS | Status: AC
  Filled 2019-08-07: qty 100

## 2019-08-07 MED ORDER — FENTANYL CITRATE (PF) 100 MCG/2ML IJ SOLN
INTRAMUSCULAR | Status: DC | PRN
  Administered 2019-08-07: 50 ug via INTRAVENOUS

## 2019-08-07 MED ORDER — CEFAZOLIN SODIUM-DEXTROSE 1-4 GM/50ML-% IV SOLN
INTRAVENOUS | Status: AC
  Filled 2019-08-07: qty 50

## 2019-08-07 MED ORDER — 0.9 % SODIUM CHLORIDE (POUR BTL) OPTIME
TOPICAL | Status: DC | PRN
  Administered 2019-08-07: 15:00:00 300 mL

## 2019-08-07 MED ORDER — CHLORHEXIDINE GLUCONATE 4 % EX LIQD: 60.0000 mL | Freq: Once | CUTANEOUS | Status: DC

## 2019-08-07 MED ORDER — KETOROLAC TROMETHAMINE 30 MG/ML IJ SOLN
INTRAMUSCULAR | Status: AC
  Filled 2019-08-07: qty 1

## 2019-08-07 MED ORDER — MIDAZOLAM HCL 2 MG/2ML IJ SOLN
1.0000 mg | INTRAMUSCULAR | Status: DC | PRN
  Administered 2019-08-07: 13:00:00 2 mg via INTRAVENOUS

## 2019-08-07 MED ORDER — FENTANYL CITRATE (PF) 100 MCG/2ML IJ SOLN
50.0000 ug | INTRAMUSCULAR | Status: AC | PRN
  Administered 2019-08-07 (×3): 50 ug via INTRAVENOUS

## 2019-08-07 MED ORDER — PROPOFOL 10 MG/ML IV BOLUS
INTRAVENOUS | Status: DC | PRN
  Administered 2019-08-07: 200 mg via INTRAVENOUS

## 2019-08-07 MED ORDER — OXYCODONE-ACETAMINOPHEN 5-325 MG PO TABS: 1.0000 | ORAL_TABLET | Freq: Four times a day (QID) | ORAL | 0 refills | Status: DC | PRN

## 2019-08-07 MED ORDER — PROMETHAZINE HCL 25 MG/ML IJ SOLN: 6.2500 mg | INTRAMUSCULAR | Status: DC | PRN

## 2019-08-07 MED ORDER — HYDROMORPHONE HCL 1 MG/ML IJ SOLN
0.2500 mg | INTRAMUSCULAR | Status: DC | PRN
  Administered 2019-08-07 (×3): 0.5 mg via INTRAVENOUS

## 2019-08-07 MED ORDER — CEFAZOLIN SODIUM-DEXTROSE 2-4 GM/100ML-% IV SOLN
2.0000 g | INTRAVENOUS | Status: AC
  Administered 2019-08-07: 14:00:00 3 g via INTRAVENOUS

## 2019-08-07 SURGICAL SUPPLY — 68 items
BENZOIN TINCTURE PRP APPL 2/3 (GAUZE/BANDAGES/DRESSINGS) ×3 IMPLANT
BLADE SURG 15 STRL LF DISP TIS (BLADE) ×2 IMPLANT
BLADE SURG 15 STRL SS (BLADE) ×4
BNDG ELASTIC 3X5.8 VLCR STR LF (GAUZE/BANDAGES/DRESSINGS) ×3 IMPLANT
BNDG ELASTIC 4X5.8 VLCR STR LF (GAUZE/BANDAGES/DRESSINGS) ×3 IMPLANT
BNDG ESMARK 4X9 LF (GAUZE/BANDAGES/DRESSINGS) ×3 IMPLANT
CANISTER SUCT 1200ML W/VALVE (MISCELLANEOUS) ×3 IMPLANT
CLIP VESOCCLUDE MED 6/CT (CLIP) IMPLANT
CLIP VESOCCLUDE SM WIDE 6/CT (CLIP) IMPLANT
CLOSURE WOUND 1/2 X4 (GAUZE/BANDAGES/DRESSINGS) ×1
COVER BACK TABLE 60X90IN (DRAPES) ×3 IMPLANT
COVER WAND RF STERILE (DRAPES) IMPLANT
CUFF TOURN SGL QUICK 18X3 (MISCELLANEOUS) ×3 IMPLANT
CUFF TOURN SGL QUICK 18X4 (TOURNIQUET CUFF) IMPLANT
DECANTER SPIKE VIAL GLASS SM (MISCELLANEOUS) ×3 IMPLANT
DRAPE EXTREMITY T 121X128X90 (DISPOSABLE) ×3 IMPLANT
DRAPE OEC MINIVIEW 54X84 (DRAPES) ×3 IMPLANT
DRAPE U-SHAPE 47X51 STRL (DRAPES) ×3 IMPLANT
DRSG AQUACEL AG ADV 3.5X 6 (GAUZE/BANDAGES/DRESSINGS) ×6 IMPLANT
DRSG EMULSION OIL 3X3 NADH (GAUZE/BANDAGES/DRESSINGS) IMPLANT
DURAPREP 26ML APPLICATOR (WOUND CARE) ×3 IMPLANT
ELECT REM PT RETURN 9FT ADLT (ELECTROSURGICAL) ×3
ELECTRODE REM PT RTRN 9FT ADLT (ELECTROSURGICAL) ×1 IMPLANT
GAUZE SPONGE 4X4 12PLY STRL (GAUZE/BANDAGES/DRESSINGS) ×3 IMPLANT
GLOVE BIOGEL PI IND STRL 8 (GLOVE) ×2 IMPLANT
GLOVE BIOGEL PI INDICATOR 8 (GLOVE) ×4
GLOVE ECLIPSE 7.5 STRL STRAW (GLOVE) ×6 IMPLANT
GOWN STRL REUS W/ TWL LRG LVL3 (GOWN DISPOSABLE) ×1 IMPLANT
GOWN STRL REUS W/ TWL XL LVL3 (GOWN DISPOSABLE) ×1 IMPLANT
GOWN STRL REUS W/TWL LRG LVL3 (GOWN DISPOSABLE) ×2
GOWN STRL REUS W/TWL XL LVL3 (GOWN DISPOSABLE) ×5 IMPLANT
K-WIRE .045X4 (WIRE) IMPLANT
LOOP VESSEL MAXI BLUE (MISCELLANEOUS) IMPLANT
NEEDLE HYPO 22GX1.5 SAFETY (NEEDLE) ×3 IMPLANT
NS IRRIG 1000ML POUR BTL (IV SOLUTION) ×3 IMPLANT
PACK BASIN DAY SURGERY FS (CUSTOM PROCEDURE TRAY) ×3 IMPLANT
PAD CAST 4YDX4 CTTN HI CHSV (CAST SUPPLIES) ×2 IMPLANT
PADDING CAST ABS 4INX4YD NS (CAST SUPPLIES) ×2
PADDING CAST ABS COTTON 4X4 ST (CAST SUPPLIES) ×1 IMPLANT
PADDING CAST COTTON 4X4 STRL (CAST SUPPLIES) ×4
PENCIL SMOKE EVACUATOR (MISCELLANEOUS) ×3 IMPLANT
SLEEVE SCD COMPRESS KNEE MED (MISCELLANEOUS) ×3 IMPLANT
SLING ARM FOAM STRAP LRG (SOFTGOODS) ×3 IMPLANT
SPLINT FAST PLASTER 5X30 (CAST SUPPLIES)
SPLINT FIBERGLASS 3X35 (CAST SUPPLIES) IMPLANT
SPLINT FIBERGLASS 4X30 (CAST SUPPLIES) ×3 IMPLANT
SPLINT PLASTER CAST FAST 5X30 (CAST SUPPLIES) IMPLANT
STOCKINETTE 4X48 STRL (DRAPES) ×3 IMPLANT
STRIP CLOSURE SKIN 1/2X4 (GAUZE/BANDAGES/DRESSINGS) ×2 IMPLANT
SUCTION FRAZIER HANDLE 10FR (MISCELLANEOUS) ×2
SUCTION TUBE FRAZIER 10FR DISP (MISCELLANEOUS) ×1 IMPLANT
SUT MNCRL AB 3-0 PS2 18 (SUTURE) IMPLANT
SUT VIC AB 0 CT1 27 (SUTURE)
SUT VIC AB 0 CT1 27XBRD ANBCTR (SUTURE) IMPLANT
SUT VIC AB 3-0 SH 27 (SUTURE) ×2
SUT VIC AB 3-0 SH 27X BRD (SUTURE) ×1 IMPLANT
SUTURE LASSO SD WIRE LOOP 1.8 (DISPOSABLE) ×1 IMPLANT
SUTURE TAPE 1.3 FIBERLOP 20 ST (SUTURE) IMPLANT
SUTURELASSO SD WIRE LOOP 1.8 (DISPOSABLE) ×3
SUTURETAPE 1.3 FIBERLOOP 20 ST (SUTURE)
SYR BULB 3OZ (MISCELLANEOUS) ×3 IMPLANT
SYR CONTROL 10ML LL (SYRINGE) IMPLANT
SYSTEM ARTHRO FOR DISTAL BICEP (Orthopedic Implant) ×3 IMPLANT
TOWEL GREEN STERILE FF (TOWEL DISPOSABLE) ×3 IMPLANT
TUBE CONNECTING 20'X1/4 (TUBING) ×1
TUBE CONNECTING 20X1/4 (TUBING) ×2 IMPLANT
UNDERPAD 30X36 HEAVY ABSORB (UNDERPADS AND DIAPERS) ×3 IMPLANT
YANKAUER SUCT BULB TIP NO VENT (SUCTIONS) ×3 IMPLANT

## 2019-08-07 NOTE — Anesthesia Postprocedure Evaluation (Signed)
Anesthesia Post Note  Patient: Micheal Chavez  Procedure(s) Performed: LEFT DISTAL BICEPS TENDON REPAIR (Left Arm Upper)     Patient location during evaluation: PACU Anesthesia Type: General Level of consciousness: awake and alert Pain management: pain level controlled Vital Signs Assessment: post-procedure vital signs reviewed and stable Respiratory status: spontaneous breathing, nonlabored ventilation, respiratory function stable and patient connected to nasal cannula oxygen Cardiovascular status: blood pressure returned to baseline and stable Postop Assessment: no apparent nausea or vomiting Anesthetic complications: no    Last Vitals:  Vitals:   08/07/19 1530 08/07/19 1545  BP: 126/85 132/88  Pulse: 70 70  Resp: 12 10  Temp:    SpO2: 100% 100%    Last Pain:  Vitals:   08/07/19 1559  TempSrc:   PainSc: 7                  [name]

## 2019-08-07 NOTE — Anesthesia Procedure Notes (Signed)
Anesthesia Procedure Image    

## 2019-08-07 NOTE — Discharge Instructions (Signed)
Use sling with splint until Monday.  On Monday you can remove the splint and the cotton wrapping.  You have a waterproof rubber-looking dressing on your incision.  Leave that in place..  At that point you can do gentle range of motion with your elbow doing active extension (straightening your elbow) and passive flexion as tolerated.  You probably want to wear the sling most of the time except for coming out while exercising.  You can apply ice.  If your arm is swollen you could wrap it from your hand up to your upper arm with the Ace wraps.  Move your fingers as tolerated.  Use your pain medicine as needed.  You can call Rosanne Ashing or text Rosanne Ashing if needed.    Post Anesthesia Home Care Instructions  Activity: Get plenty of rest for the remainder of the day. A responsible individual must stay with you for 24 hours following the procedure.  For the next 24 hours, DO NOT: -Drive a car -Advertising copywriter -Drink alcoholic beverages -Take any medication unless instructed by your physician -Make any legal decisions or sign important papers.  Meals: Start with liquid foods such as gelatin or soup. Progress to regular foods as tolerated. Avoid greasy, spicy, heavy foods. If nausea and/or vomiting occur, drink only clear liquids until the nausea and/or vomiting subsides. Call your physician if vomiting continues.  Special Instructions/Symptoms: Your throat may feel dry or sore from the anesthesia or the breathing tube placed in your throat during surgery. If this causes discomfort, gargle with warm salt water. The discomfort should disappear within 24 hours.  If you had a scopolamine patch placed behind your ear for the management of post- operative nausea and/or vomiting:  1. The medication in the patch is effective for 72 hours, after which it should be removed.  Wrap patch in a tissue and discard in the trash. Wash hands thoroughly with soap and water. 2. You may remove the patch earlier than 72 hours if you  experience unpleasant side effects which may include dry mouth, dizziness or visual disturbances. 3. Avoid touching the patch. Wash your hands with soap and water after contact with the patch.   Regional Anesthesia Blocks  1. Numbness or the inability to move the "blocked" extremity may last from 3-48 hours after placement. The length of time depends on the medication injected and your individual response to the medication. If the numbness is not going away after 48 hours, call your surgeon.  2. The extremity that is blocked will need to be protected until the numbness is gone and the  Strength has returned. Because you cannot feel it, you will need to take extra care to avoid injury. Because it may be weak, you may have difficulty moving it or using it. You may not know what position it is in without looking at it while the block is in effect.  3. For blocks in the legs and feet, returning to weight bearing and walking needs to be done carefully. You will need to wait until the numbness is entirely gone and the strength has returned. You should be able to move your leg and foot normally before you try and bear weight or walk. You will need someone to be with you when you first try to ensure you do not fall and possibly risk injury.  4. Bruising and tenderness at the needle site are common side effects and will resolve in a few days.  5. Persistent numbness or new problems with  movement should be communicated to the surgeon or the [facility] [phone] [place] [phone]).

## 2019-08-07 NOTE — Interval H&P Note (Signed)
History and Physical Interval Note:  08/07/2019 1:23 PM  Micheal Chavez  has presented today for surgery, with the diagnosis of LEFT DISTAL BICEPS TENDON RUPTURE.  The various methods of treatment have been discussed with the patient and family. After consideration of risks, benefits and other options for treatment, the patient has consented to  Procedure(s): LEFT DISTAL BICEPS TENDON REPAIR (Left) as a surgical intervention.  The patient's history has been reviewed, patient examined, no change in status, stable for surgery.  I have reviewed the patient's chart and labs.  Questions were answered to the patient's satisfaction.     Harvie Junior

## 2019-08-07 NOTE — Anesthesia Preprocedure Evaluation (Signed)
Anesthesia Evaluation  Patient identified by MRN, date of birth, ID band Patient awake    Reviewed: Allergy & Precautions, NPO status , Patient's Chart, lab work & pertinent test results  Airway Mallampati: II  TM Distance: >3 FB Neck ROM: Full    Dental no notable dental hx.    Pulmonary sleep apnea and Continuous Positive Airway Pressure Ventilation , former smoker,    Pulmonary exam normal breath sounds clear to auscultation       Cardiovascular hypertension, + CAD  Normal cardiovascular exam Rhythm:Regular Rate:Normal     Neuro/Psych negative neurological ROS  negative psych ROS   GI/Hepatic negative GI ROS, Neg liver ROS,   Endo/Other  diabetes  Renal/GU negative Renal ROS  negative genitourinary   Musculoskeletal negative musculoskeletal ROS (+)   Abdominal   Peds negative pediatric ROS (+)  Hematology negative hematology ROS (+)   Anesthesia Other Findings   Reproductive/Obstetrics negative OB ROS                             Anesthesia Physical Anesthesia Plan  ASA: III  Anesthesia Plan: General   Post-op Pain Management:    Induction: Intravenous  PONV Risk Score and Plan: 2 and Ondansetron, Dexamethasone and Treatment may vary due to age or medical condition  Airway Management Planned: LMA  Additional Equipment:   Intra-op Plan:   Post-operative Plan: Extubation in OR  Informed Consent: I have reviewed the patients History and Physical, chart, labs and discussed the procedure including the risks, benefits and alternatives for the proposed anesthesia with the patient or authorized representative who has indicated his/her understanding and acceptance.     Dental advisory given  Plan Discussed with: CRNA and Surgeon  Anesthesia Plan Comments:         Anesthesia Quick Evaluation

## 2019-08-07 NOTE — Brief Op Note (Signed)
08/07/2019  3:15 PM  PATIENT:  Micheal Chavez  59 y.o. male  PRE-OPERATIVE DIAGNOSIS:  LEFT DISTAL BICEPS TENDON RUPTURE  POST-OPERATIVE DIAGNOSIS:  LEFT DISTAL BICEPS TENDON RUPTURE  PROCEDURE:  Procedure(s): LEFT DISTAL BICEPS TENDON REPAIR (Left)  SURGEON:  Surgeon(s) and Role:    Jodi Geralds, MD - Primary  PHYSICIAN ASSISTANT:   ASSISTANTS: jim bethune   ANESTHESIA:   general  EBL:  25 mL   BLOOD ADMINISTERED:none  DRAINS: none   LOCAL MEDICATIONS USED:  MARCAINE     SPECIMEN:  No Specimen  DISPOSITION OF SPECIMEN:  N/A  COUNTS:  YES  TOURNIQUET:   Total Tourniquet Time Documented: Upper Arm (Left) - 58 minutes Total: Upper Arm (Left) - 58 minutes   DICTATION: .Other Dictation: Dictation Number [id number]  PLAN OF CARE: Discharge to home after PACU  PATIENT DISPOSITION:  PACU - hemodynamically stable.   Delay start of Pharmacological VTE agent (>24hrs) due to surgical blood loss or risk of bleeding: no

## 2019-08-07 NOTE — Transfer of Care (Signed)
Immediate Anesthesia Transfer of Care Note  Patient: Micheal Chavez  Procedure(s) Performed: LEFT DISTAL BICEPS TENDON REPAIR (Left Arm Upper)  Patient Location: PACU  Anesthesia Type:General  Level of Consciousness: awake, alert  and oriented  Airway & Oxygen Therapy: Patient Spontanous Breathing and Patient connected to nasal cannula oxygen  Post-op Assessment: Report given to RN and Post -op Vital signs reviewed and stable  Post vital signs: Reviewed and stable  Last Vitals:  Vitals Value Taken Time  BP 133/78 08/07/19 1518  Temp    Pulse 76 08/07/19 1521  Resp 12 08/07/19 1521  SpO2 100 % 08/07/19 1521  Vitals shown include unvalidated device data.  Last Pain:  Vitals:   08/07/19 1146  TempSrc: Oral  PainSc: 0-No pain         Complications: No apparent anesthesia complications

## 2019-08-07 NOTE — Progress Notes (Signed)
Assisted Dr. Rose with left, ultrasound guided, supraclavicular block. Side rails up, monitors on throughout procedure. See vital signs in flow sheet. Tolerated Procedure well. 

## 2019-08-07 NOTE — Anesthesia Procedure Notes (Signed)
Anesthesia Regional Block: Supraclavicular block   Pre-Anesthetic Checklist: ,, timeout performed, Correct Patient, Correct Site, Correct Laterality, Correct Procedure, Correct Position, site marked, Risks and benefits discussed,  Surgical consent,  Pre-op evaluation,  At surgeon's request and post-op pain management  Laterality: Left  Prep: chloraprep       Needles:  Injection technique: Single-shot  Needle Type: Echogenic Needle     Needle Length: 9cm      Additional Needles:   Procedures:,,,, ultrasound used (permanent image in chart),,,,  Narrative:  Start time: 08/07/2019 12:32 PM End time: 08/07/2019 12:40 PM Injection made incrementally with aspirations every 5 mL.  Performed by: Personally  Anesthesiologist: Eilene Ghazi, MD  Additional Notes: Patient tolerated the procedure well without complications

## 2019-08-07 NOTE — Anesthesia Procedure Notes (Signed)
Procedure Name: LMA Insertion Date/Time: 08/07/2019 1:35 PM Performed by: Mayer Camel, CRNA Pre-anesthesia Checklist: Patient identified, Emergency Drugs available, Suction available and Patient being monitored Patient Re-evaluated:Patient Re-evaluated prior to induction Oxygen Delivery Method: Circle System Utilized Preoxygenation: Pre-oxygenation with 100% oxygen Induction Type: IV induction LMA: LMA inserted LMA Size: 5.0 Number of attempts: 1 Airway Equipment and Method: Bite block Placement Confirmation: positive ETCO2 Tube secured with: Tape Dental Injury: Teeth and Oropharynx as per pre-operative assessment

## 2019-08-10 ENCOUNTER — Encounter: Payer: Self-pay | Admitting: *Deleted

## 2019-08-10 NOTE — Addendum Note (Signed)
Addendum  created 08/10/19 1110 by Lance Coon, CRNA   Charge Capture section accepted, Visit diagnoses modified

## 2019-08-10 NOTE — Op Note (Signed)
NAME: Micheal Chavez, COONRADT MEDICAL RECORD [id number] ACCOUNT [id number] DATE OF BIRTH:18-Jan-1961 FACILITY: [facility] LOCATION: [facility] PHYSICIAN:Jovonta Levit L. Aissatou Fronczak, MD  OPERATIVE REPORT  DATE OF PROCEDURE:  08/07/2019  PREOPERATIVE DIAGNOSIS:  Chronic tear of the biceps tendon about almost 32 weeks old.  POSTOPERATIVE DIAGNOSIS:  Chronic tear of the biceps tendon about almost 32 weeks old.  PROCEDURE: 1. Distal biceps tendon repair with an Endobutton as well as a backup cortical screw fixation Interpretation of multiple intraoperative fluoroscopic images. 2. Interpretation of multiple intraoperative fluoroscopic images  SURGEON:  Dorna Leitz, MD  ASSISTANT:  Gary Fleet PA-C, was present for the entire case and assisted by retraction, manipulation of the arm, and closing to minimize OR time.  BRIEF HISTORY:  The patient is a 59 year old male with a long history of significant complaints of left arm pain.  He was lifting something heavy and heard a pop in his arm.  He was out of town.  We talked to him over the phone and were able to sort of  diagnosis our concerns for distal biceps tendon rupture.  We got an MRI with his out of town position, which confirmed this and then he came down for surgical intervention when we could get an operating room to help fix him.  He was brought to the  operating room for distal biceps fixation left side.  DESCRIPTION OF PROCEDURE:  The patient brought to the operating room after adequate anesthesia was obtained with general anesthetic, placed on the operating table.  Left arm was prepped and draped in usual sterile fashion.  Following this, the arm was  exsanguinated, blood pressure tourniquet inflated to 250 mmHg.  Following this, a transverse incision was made just distal to the forearm wrist crease.  We then dissected for and found the tendon.  It was scarred in dramatically and I had to really get  the scar tendon retracted down to its insertion and just  release some of the scar tissue in there and then had to probably resected about 3-4 cm of scar tissue off of the distal tendon.  We then cleaned up the back to a nice normal tendon and then did a  whipstitch on and tried to pull it to see.  We really needed to then free up scar tissue around the biceps muscle itself, which really gave it a little bit more translation and at this point, we felt like we had adequate translation of the biceps tendon  into Endobutton technique with the Bio-Tenodesis screw and so we decided to do this.  At this point, I put retractors down over the radial tuberosity, I put a guidewire in to check the center portion and felt like it was just a little too proximal, so I  moved the distal, took an 8 mm reamer and over ream the first cortex after advancing it through the second cortex.  Once that was done, the sutures were then passed through the Endobutton and the Endobutton was then under direct vision, passed through  the cortex and then pulled back up against the bone.  Fluoroscopy was used at this point, to make sure it was in good position.  At this point, with alternating pulls, we were able to pull the tendon down into the tunnel and once that was done, we did a  suture across the tendon holding it right in the tunnel and then tied this down and then took the Bio-Tenodesis screw and kind of took that and advanced it into the  cortical tunnel.  It was a 7 x 10 mm screw.  Dramatic fixation was achieved at this  point.  We then went back and did a little more releasing of the biceps muscle proximally just to make sure that he could get a good excursion and came essentially straight on the table at this point.  At this point, the wound was irrigated, suctioned  dry.  I let the tourniquet down, made sure all bleeding was controlled with electrocautery.  I then irrigated one final time and then closed after final fluoroscopic images have been taken.  A sterile compressive dressing  was applied as well as a  posterior splint.  He was taken to recovery was noted to be in satisfactory condition.  Estimated blood loss for procedure was minimal.  PN/NUANCE  D:08/07/2019 T:08/08/2019 JOB:[id number]

## 2019-08-17 DIAGNOSIS — M48061 Spinal stenosis, lumbar region without neurogenic claudication: Secondary | ICD-10-CM | POA: Diagnosis not present

## 2019-08-17 DIAGNOSIS — L0591 Pilonidal cyst without abscess: Secondary | ICD-10-CM | POA: Diagnosis not present

## 2019-08-17 DIAGNOSIS — M5030 Other cervical disc degeneration, unspecified cervical region: Secondary | ICD-10-CM | POA: Diagnosis not present

## 2019-08-17 DIAGNOSIS — Z0000 Encounter for general adult medical examination without abnormal findings: Secondary | ICD-10-CM | POA: Diagnosis not present

## 2019-08-17 DIAGNOSIS — Z1331 Encounter for screening for depression: Secondary | ICD-10-CM | POA: Diagnosis not present

## 2019-08-17 DIAGNOSIS — E1169 Type 2 diabetes mellitus with other specified complication: Secondary | ICD-10-CM | POA: Diagnosis not present

## 2019-08-20 DIAGNOSIS — Z9889 Other specified postprocedural states: Secondary | ICD-10-CM | POA: Diagnosis not present

## 2019-08-20 DIAGNOSIS — Z0000 Encounter for general adult medical examination without abnormal findings: Secondary | ICD-10-CM | POA: Diagnosis not present

## 2019-08-20 DIAGNOSIS — E119 Type 2 diabetes mellitus without complications: Secondary | ICD-10-CM | POA: Diagnosis not present

## 2019-08-20 DIAGNOSIS — R8290 Unspecified abnormal findings in urine: Secondary | ICD-10-CM | POA: Diagnosis not present

## 2019-08-20 DIAGNOSIS — E7849 Other hyperlipidemia: Secondary | ICD-10-CM | POA: Diagnosis not present

## 2019-08-20 DIAGNOSIS — Z125 Encounter for screening for malignant neoplasm of prostate: Secondary | ICD-10-CM | POA: Diagnosis not present

## 2019-09-14 DIAGNOSIS — Z9889 Other specified postprocedural states: Secondary | ICD-10-CM | POA: Diagnosis not present

## 2019-09-23 DIAGNOSIS — Z4789 Encounter for other orthopedic aftercare: Secondary | ICD-10-CM | POA: Diagnosis not present

## 2019-09-23 DIAGNOSIS — M25522 Pain in left elbow: Secondary | ICD-10-CM | POA: Diagnosis not present

## 2019-09-25 DIAGNOSIS — Z4789 Encounter for other orthopedic aftercare: Secondary | ICD-10-CM | POA: Diagnosis not present

## 2019-09-25 DIAGNOSIS — M25522 Pain in left elbow: Secondary | ICD-10-CM | POA: Diagnosis not present

## 2019-09-30 DIAGNOSIS — Z4789 Encounter for other orthopedic aftercare: Secondary | ICD-10-CM | POA: Diagnosis not present

## 2019-09-30 DIAGNOSIS — M25522 Pain in left elbow: Secondary | ICD-10-CM | POA: Diagnosis not present

## 2019-10-01 DIAGNOSIS — M25522 Pain in left elbow: Secondary | ICD-10-CM | POA: Diagnosis not present

## 2019-10-01 DIAGNOSIS — Z4789 Encounter for other orthopedic aftercare: Secondary | ICD-10-CM | POA: Diagnosis not present

## 2019-10-02 DIAGNOSIS — Z4789 Encounter for other orthopedic aftercare: Secondary | ICD-10-CM | POA: Diagnosis not present

## 2019-10-02 DIAGNOSIS — M25522 Pain in left elbow: Secondary | ICD-10-CM | POA: Diagnosis not present

## 2019-10-07 DIAGNOSIS — Z4789 Encounter for other orthopedic aftercare: Secondary | ICD-10-CM | POA: Diagnosis not present

## 2019-10-07 DIAGNOSIS — M25522 Pain in left elbow: Secondary | ICD-10-CM | POA: Diagnosis not present

## 2019-10-13 DIAGNOSIS — M25522 Pain in left elbow: Secondary | ICD-10-CM | POA: Diagnosis not present

## 2019-10-13 DIAGNOSIS — Z4789 Encounter for other orthopedic aftercare: Secondary | ICD-10-CM | POA: Diagnosis not present

## 2020-02-27 DIAGNOSIS — Z20822 Contact with and (suspected) exposure to covid-19: Secondary | ICD-10-CM | POA: Diagnosis not present

## 2020-03-14 ENCOUNTER — Ambulatory Visit: Payer: Self-pay | Admitting: General Surgery

## 2020-03-14 DIAGNOSIS — I10 Essential (primary) hypertension: Secondary | ICD-10-CM | POA: Diagnosis not present

## 2020-03-14 DIAGNOSIS — E7849 Other hyperlipidemia: Secondary | ICD-10-CM | POA: Diagnosis not present

## 2020-03-14 DIAGNOSIS — U071 COVID-19: Secondary | ICD-10-CM | POA: Diagnosis not present

## 2020-03-14 DIAGNOSIS — E1169 Type 2 diabetes mellitus with other specified complication: Secondary | ICD-10-CM | POA: Diagnosis not present

## 2020-03-14 DIAGNOSIS — K648 Other hemorrhoids: Secondary | ICD-10-CM | POA: Diagnosis not present

## 2020-03-14 NOTE — H&P (Signed)
History of Present Illness Micheal Levee MD; 03/14/2020 4:03 PM) The patient is a 59 year old male who presents with a complaint of Rectal bleeding. A 59 year old male who presents to the office with recurrent bleeding internal hemorrhoids. He saw Dr. Kinnie Scales a couple years ago for similar symptoms. He underwent a colonoscopy in 2018. He also underwent hemorrhoid banding on 2 occasions but this did not help. He states that he has bleeding with almost every bowel movement now. He reports this is blood in the toilet bowl. He denies any blood in his stool. He currently takes a baby aspirin a day. He is having trouble with anemia. He takes iron supplements on a daily basis.  Past Medical History:  Diagnosis Date  . Anemia   . Borderline diabetes mellitus   . Coronary artery disease    Status post drug-eluting stent to the RCA 06/29/10. --  cath 06/29/10: LAD 30%; Cfx 30-50%; EF 60-65%  . Diabetes mellitus without complication (HCC)   . Dyslipidemia   . Glucose intolerance (impaired glucose tolerance)   . Hemorrhoids   . Hyperlipidemia   . Hypertension   . Obesity   . Previous back surgery   . Rupture of left distal biceps tendon   . Sleep apnea    Uses CPAP nightly   Past Surgical History:  Procedure Laterality Date  . BACK SURGERY     ruptured disc lower back  . CARDIAC CATHETERIZATION  06/29/2010   LAD 30%; Cfx 30-50%; EF 60-65%   . CHONDROPLASTY Right 03/14/2015   Procedure: PATELLO-FEMORAL, MEDIAL FEMORAL CONDYLE CHONDROPLASTY WITH MICROFRACTURE TECHNIQUE;  Surgeon: Jodi Geralds, MD;  Location: [facility];  Service: Orthopedics;  Laterality: Right;  . CORONARY ANGIOPLASTY WITH STENT PLACEMENT  06/29/2010   Successful percutaneous transluminal coronary angioplasty  with placement of a drug-eluting stent in the proximal right coronary  artery  . DISTAL BICEPS TENDON REPAIR Left 08/07/2019   Procedure: LEFT DISTAL BICEPS TENDON REPAIR;  Surgeon: Jodi Geralds, MD;   Location: [facility];  Service: Orthopedics;  Laterality: Left;  . EYE SURGERY Bilateral 2011,2013  . HEMORRHOID SURGERY     Status post hemorrhoid surgery x5  . KNEE ARTHROSCOPY WITH EXCISION PLICA Right 03/14/2015   Procedure: EXCISION MEDIAL PLICA;  Surgeon: Jodi Geralds, MD;  Location: [facility];  Service: Orthopedics;  Laterality: Right;  . KNEE ARTHROSCOPY WITH MEDIAL MENISECTOMY Right 03/14/2015   Procedure: RIGHT KNEE ARTHROSCOPY WITH PARTIAL MEDIAL MENISECTOMY;  Surgeon: Jodi Geralds, MD;  Location: [facility];  Service: Orthopedics;  Laterality: Right;  . LEFT HEART CATH AND CORONARY ANGIOGRAPHY N/A 12/25/2016   Procedure: Left Heart Cath and Coronary Angiography;  Surgeon: Corky Crafts, MD;  Location: [facility];  Service: Cardiovascular;  Laterality: N/A;   Review of Systems - General ROS: negative for - chills or fever Cardiovascular ROS: no chest pain or dyspnea on exertion Gastrointestinal ROS: no abdominal pain, change in bowel habits, or black or bloody stools Genito-Urinary ROS: no dysuria, trouble voiding, or hematuria   Allergies Laurette Schimke, RMA; 03/14/2020 3:20 PM) No Known Drug Allergies [05/11/2014]: Allergies Reconciled  Medication History Laurette Schimke, [place]; 03/14/2020 3:20 PM) Co Q10 (200MG  Capsule, Oral) Active. Chlorthalidone (15MG  Tablet, Oral) Active. Irbesartan (300MG  Tablet, Oral) Active. Aspirin (81MG  Tablet Chewable, Oral) Active. Lipitor (80MG  Tablet, Oral) Active. Doxazosin Mesylate (2MG  Tablet, Oral) Active. metFORMIN HCl (1000MG  Tablet, Oral) Active. Medications Reconciled    Vitals RMA;  03/14/2020 3:20 PM) 03/14/2020 3:20 PM Weight: 274 lb Height: 71in Body Surface Area: 2.41 m Body Mass Index: 38.21 kg/m  Temp.: 97.27F  Pulse: 69 (Regular)  P.OX: 97% (Room air) BP: 138/76(Sitting, Left Arm, Standard)        Physical Exam Micheal Levee  MD; 03/14/2020 4:04 PM)  General Mental Status-Alert. General Appearance-Cooperative.  Abdomen Palpation/Percussion Palpation and Percussion of the abdomen reveal - Soft and Non Tender.  Rectal Note: Significant skin tags and external disease present.   Results Micheal Levee MD; 03/14/2020 4:05 PM) Procedures  Name Value Date ANOSCOPY, DIAGNOSTIC (37628) [ Hemorrhoids ] Procedure Other: Procedure: Anoscopy....Marland KitchenMarland KitchenSurgeon: Maisie Fus....Marland KitchenMarland KitchenAfter the risks and benefits were explained, verbal consent was obtained for above procedure. A medical assistant chaperone was present thoroughout the entire procedure. ....Marland KitchenMarland KitchenAnesthesia: none....Marland KitchenMarland KitchenDiagnosis: rectal bleeding....Marland KitchenMarland KitchenFindings: Grade 3 left lateral internal hemorrhoid with signs of inflammation and active bleeding. Grade 2 right posterior and right anterior hemorrhoids.  Performed: 03/14/2020 4:04 PM    Assessment & Plan Micheal Levee MD; 03/14/2020 4:04 PM)  BLEEDING INTERNAL HEMORRHOIDS (K64.8) Impression: 59 year old male who presents to the office for evaluation of internal hemorrhoids. On exam today, he has a significantly inflamed left sided internal hemorrhoid, grade 3. I think this is the source of his rectal bleeding. The patient thinks that he is also due for a colonoscopy. We discussed performing a trans-hemorrhoidal dearterialization and a couple months and then performing colonoscopy after he heals from this. We have discussed this procedure in detail including risks, benefits and alternatives. We discussed the chances of this resolving his bleeding. All questions were answered.  Current Plans

## 2020-04-04 DIAGNOSIS — R310 Gross hematuria: Secondary | ICD-10-CM | POA: Diagnosis not present

## 2020-04-04 DIAGNOSIS — N390 Urinary tract infection, site not specified: Secondary | ICD-10-CM | POA: Diagnosis not present

## 2020-04-04 DIAGNOSIS — N200 Calculus of kidney: Secondary | ICD-10-CM | POA: Diagnosis not present

## 2020-04-05 ENCOUNTER — Ambulatory Visit
Admission: RE | Admit: 2020-04-05 | Discharge: 2020-04-05 | Disposition: A | Discharge status: Home / Self Care | Payer: BC Managed Care – PPO | Source: Ambulatory Visit | Attending: Internal Medicine | Admitting: Internal Medicine

## 2020-04-05 ENCOUNTER — Other Ambulatory Visit: Payer: Self-pay | Admitting: Internal Medicine

## 2020-04-05 DIAGNOSIS — R310 Gross hematuria: Secondary | ICD-10-CM

## 2020-04-05 DIAGNOSIS — N200 Calculus of kidney: Secondary | ICD-10-CM

## 2020-04-05 DIAGNOSIS — R319 Hematuria, unspecified: Secondary | ICD-10-CM | POA: Diagnosis not present

## 2020-04-14 ENCOUNTER — Other Ambulatory Visit: Payer: Self-pay | Admitting: Urology

## 2020-04-14 DIAGNOSIS — N201 Calculus of ureter: Secondary | ICD-10-CM | POA: Diagnosis not present

## 2020-04-14 DIAGNOSIS — R1084 Generalized abdominal pain: Secondary | ICD-10-CM | POA: Diagnosis not present

## 2020-04-14 DIAGNOSIS — Z125 Encounter for screening for malignant neoplasm of prostate: Secondary | ICD-10-CM | POA: Diagnosis not present

## 2020-04-15 NOTE — H&P (Signed)
Office Visit Report     04/14/2020   --------------------------------------------------------------------------------   Micheal Chavez  MRN: [id number]  DOB: 12/04/1960, 59 year old Male  SSN:    PRIMARY CARE:  Mark A. Waynard Edwards, MD  REFERRING:  Redge Gainer. Waynard Edwards, MD  PROVIDER:  Jettie Pagan, M.D.  LOCATION:  [facility] [phone]     --------------------------------------------------------------------------------   CC/HPI: Micheal Chavez is a 59 year old male who is seen in consultation today for a right distal ureteral stone.   He developed acute onset right flank pain that prompted CT PE. CT A/P on 04/05/2020 revealed right ureteral calculus measuring 7 mm associated with mild right hydroureter. He states pain is well controlled at this time. He denies passing the stone or other debris. He denies fevers or chills. No gross hematuria. No dysuria. He denies any increase in lower urinary tract symptoms. He is interested in ESWL. He would like to have this arranged soon as possible. No sign of urinary tract infection today. Denies any anticoagulation other than aspirin and advised to hold this prior to ESWL.   He is also complains of erectile dysfunction. Difficulty obtaining maintaining erection. Interested in sildenafil. I prescribed a prescription to be completed at his pharmacy.     ALLERGIES: No Allergies    MEDICATIONS: Aspirin 81 mg tablet,chewable  Metformin Hcl 1,000 mg tablet  Chlorthalidone 25 mg tablet  Co Q10 200 mg capsule  Doxazosin Mesylate 2 mg tablet  Irbesartan 300 mg tablet  Lipitor 80 MG Oral Tablet Oral  Vitamin B12  Zinc     GU PSH: None     PSH Notes: Hemorrhoidectomy, Heart Surgery, Cataract Surgery, Back Surgery, bicep tendon   NON-GU PSH: Back Surgery (Unspecified) Cataract surgery, Bilateral Hemorrhoidectomy (favorite) - 2012     GU PMH: Ureteral calculus, Distal Ureteral Stone On The Left - 2014      PMH Notes:  1898-06-25  00:00:00 - Note: Normal Routine History And Physical Adult   NON-GU PMH: Personal history of other diseases of the circulatory system, History of hypertension - 2014, History of cardiac disorder, - 2014 Personal history of other endocrine, nutritional and metabolic disease, History of hypercholesterolemia - 2014 Personal history of other specified conditions, History of heartburn - 2014 Anxiety Arthritis Diabetes Type 2 Heart disease, unspecified Hypertension    FAMILY HISTORY: Family Health Status - Father alive at age 37 - Father Family Health Status - Mother's Age - Mother Family Health Status Number - Runs In Family Heart Disease - Father, Mother   SOCIAL HISTORY: Marital Status: Married Preferred Language: English; Ethnicity: Not Hispanic Or Latino; Race: White Current Smoking Status: Patient does not smoke anymore. Has not smoked since 03/26/2003.   Tobacco Use Assessment Completed: Used Tobacco in last 30 days? Drinks 3 drinks per week.  Drinks 3 caffeinated drinks per day.     Notes: Tobacco use, Marital History - Currently Married, Occupation:, Caffeine Use, Being A Social Drinker   REVIEW OF SYSTEMS:    GU Review Male:   Patient reports frequent urination, erection problems, and get up at night to urinate. Patient denies have to strain to urinate , trouble starting your stream, penile pain, burning/ pain with urination, stream starts and stops, hard to postpone urination, and leakage of urine.  Gastrointestinal (Upper):   Patient denies nausea, vomiting, and indigestion/ heartburn.  Gastrointestinal (Lower):   Patient denies diarrhea and constipation.  Constitutional:   Patient denies fever, night sweats, weight loss, and fatigue.  Skin:  Patient denies skin rash/ lesion and itching.  Eyes:   Patient denies blurred vision and double vision.  Ears/ Nose/ Throat:   Patient denies sore throat and sinus problems.  Hematologic/Lymphatic:   Patient denies swollen glands and  easy bruising.  Cardiovascular:   Patient denies leg swelling and chest pains.  Respiratory:   Patient denies cough and shortness of breath.  Endocrine:   Patient denies excessive thirst.  Musculoskeletal:   Patient denies back pain and joint pain.  Neurological:   Patient denies headaches and dizziness.  Psychologic:   Patient denies depression and anxiety.   VITAL SIGNS:      04/14/2020 09:15 AM  Weight 270 lb / 122.47 kg  Height 71 in / 180.34 cm  BP 150/84 mmHg  Pulse 61 /min  Temperature 97.8 F / 36.5 C  BMI 37.7 kg/m   MULTI-SYSTEM PHYSICAL EXAMINATION:    Constitutional: Well-nourished. No physical deformities. Normally developed. Good grooming.  Respiratory: No labored breathing, no use of accessory muscles.   Cardiovascular: Normal temperature, normal extremity pulses, no swelling, no varicosities.  Gastrointestinal: No mass, no tenderness, no rigidity, non obese abdomen.     Complexity of Data:  Source Of History:  Patient, Medical Record Summary  Lab Test Review:   PSA  Records Review:   AUA Symptom Score  X-Ray Review: KUB: Reviewed Films. Reviewed Report.  C.T. Abdomen/Pelvis: Reviewed Films. Reviewed Report.     PROCEDURES:         KUB - [id number]  A single view of the abdomen is obtained. A calculus approximately 76mm is seen in expected location of distal right ureter.      . Patient confirmed No Neulasta OnPro Device.     ASSESSMENT:      ICD-10 Details  1 GU:   Ureteral calculus - N20.1   2   Flank Pain - R10.84   3   Encounter for Prostate Cancer screening - Z12.5    PLAN:            Medications New Meds: Sildenafil Citrate 100 mg tablet 1 tablet PO Daily   #6  11 Refill(s)            Orders Labs Urinalysis, CULTURE, URINE  X-Rays: KUB          Schedule         Document Letter(s):  Created for Patient: Clinical Summary         Notes:   1. Right distal ureteral stone: Seen on CT A/P 04/05/2020 measuring 7 mm with mild right hydroureter.  Discussed options for treatment. Patient elects for ESWL. KUB obtained today demonstrates presence of right distal ureteral stone measuring 27 mm. Send urine for culture today. Hopefully ESWL will be arranged this coming Monday. Advised to hold anticoagulation with aspirin.   We discussed the options for management of ureteral stones, including trial of passage with medical expulsive therapy, ESWL, and ureteroscopy with laser lithotripsy. Smaller and more distal stones are best managed with trial of passage with medical expulsive therapy. Larger and more proximal stones are less likely to pass spontaneously, and are therefore best managed with either ESWL or ureteroscopy, with ureteroscopy offering higher stone free rates. The risks and benefits of each option were discussed.   Trial of passage: oral analgesics and medical expulsive therapy with an alpha blocker will be provided to assist with stone passage. The patient is instructed to return or go to ER for emergent intervention if intolerable pain, intractable  nausea/vomiting, or fever develops.   ESWL: risks and benefits of ESWL were outlined including infection, bleeding, pain, steinstrasse, kidney injury, need for ancillary treatments, and global anesthesia risks including but not limited to CVA, MI, DVT, PE, pneumonia, and death.   Ureteroscopy: risks and benefits of ureteroscopy were outlined, including infection, bleeding, pain, temporary ureteral stent and associated stent bother, ureteral injury, ureteral stricture, need for ancillary treatments, and global anesthesia risks including but not limited to CVA, MI, DVT, PE, pneumonia, and death.   #2. Erectile dysfunction: Difficulty obtaining and maintaining. Prescribed sildenafil 100 mg.  The pathophysiology and physiology of erections were discussed. I then discussed with the patient from the standpoint of "potential targets" how pharmacologic therapy works and how combination therapy can be  more efficacious than monotherapy. Additionally, the place for oral therapy as well as urethral suppositories was also discussed with the patient.   The patient was told that the approach here is truly to begin with the less invasive therapy which would be oral therapy, moving on to intracorporal therapy if we could not get a successful result with oral therapy and if we could not get a success result with intracavernosal therapy then it might be appropriate to discuss prosthetic placement. The risks of intracorporal therapy were discussed including bleeding, bruising and the development of penile curvature. They understand that prostheses, while very good, are at this center considered to be the course of last resort. They are such in that they have a finite life span, generally in the time of about 10 years, and with revision, the complication rate is higher. The patient was also counseled with the option of continued watchful waiting.   Plan to start with trial of medication. We discussed the Nitrate contraindications and potential side effects of these medications such as headache, facial flushing, GI upset, nasal congestion and priapism.  Rx provided for sildenafil 100mg  mg     * Signed by , M.D. on 04/14/20 at 11:48 AM (EDT)*     The information contained in this medical record document is considered private and confidential patient information. This information can only be used for the medical diagnosis and/or medical services that are being provided by the patient's selected caregivers. This information can only be distributed outside of the patient's care if the patient agrees and signs waivers of authorization for this information to be sent to an outside source or route.

## 2020-04-15 NOTE — Progress Notes (Signed)
Talked with patient. Instructions given, arrival time 0800. Npo after MN. Cl. Liquids until 4 AM. Wife is the driver

## 2020-04-18 ENCOUNTER — Other Ambulatory Visit: Payer: Self-pay

## 2020-04-18 ENCOUNTER — Ambulatory Visit (HOSPITAL_COMMUNITY)
Admission: RE | Admit: 2020-04-18 | Discharge: 2020-04-18 | Disposition: A | Discharge status: Home / Self Care | Payer: BC Managed Care – PPO | Attending: Urology | Admitting: Urology

## 2020-04-18 ENCOUNTER — Encounter (HOSPITAL_BASED_OUTPATIENT_CLINIC_OR_DEPARTMENT_OTHER)
Admission: RE | Disposition: A | Discharge status: Home / Self Care | Payer: Self-pay | Source: Home / Self Care | Attending: Urology

## 2020-04-18 ENCOUNTER — Ambulatory Visit (HOSPITAL_COMMUNITY): Payer: BC Managed Care – PPO

## 2020-04-18 ENCOUNTER — Encounter (HOSPITAL_BASED_OUTPATIENT_CLINIC_OR_DEPARTMENT_OTHER): Payer: Self-pay | Admitting: Urology

## 2020-04-18 DIAGNOSIS — N132 Hydronephrosis with renal and ureteral calculous obstruction: Secondary | ICD-10-CM | POA: Diagnosis not present

## 2020-04-18 DIAGNOSIS — Z8249 Family history of ischemic heart disease and other diseases of the circulatory system: Secondary | ICD-10-CM | POA: Diagnosis not present

## 2020-04-18 DIAGNOSIS — K8020 Calculus of gallbladder without cholecystitis without obstruction: Secondary | ICD-10-CM | POA: Diagnosis not present

## 2020-04-18 DIAGNOSIS — I10 Essential (primary) hypertension: Secondary | ICD-10-CM | POA: Diagnosis not present

## 2020-04-18 DIAGNOSIS — N201 Calculus of ureter: Secondary | ICD-10-CM | POA: Diagnosis not present

## 2020-04-18 DIAGNOSIS — M1990 Unspecified osteoarthritis, unspecified site: Secondary | ICD-10-CM | POA: Insufficient documentation

## 2020-04-18 DIAGNOSIS — Z79899 Other long term (current) drug therapy: Secondary | ICD-10-CM | POA: Insufficient documentation

## 2020-04-18 DIAGNOSIS — N2889 Other specified disorders of kidney and ureter: Secondary | ICD-10-CM | POA: Diagnosis not present

## 2020-04-18 DIAGNOSIS — Z87891 Personal history of nicotine dependence: Secondary | ICD-10-CM | POA: Diagnosis not present

## 2020-04-18 DIAGNOSIS — Z7984 Long term (current) use of oral hypoglycemic drugs: Secondary | ICD-10-CM | POA: Insufficient documentation

## 2020-04-18 DIAGNOSIS — Z7982 Long term (current) use of aspirin: Secondary | ICD-10-CM | POA: Diagnosis not present

## 2020-04-18 DIAGNOSIS — K8080 Other cholelithiasis without obstruction: Secondary | ICD-10-CM | POA: Diagnosis not present

## 2020-04-18 HISTORY — PX: EXTRACORPOREAL SHOCK WAVE LITHOTRIPSY: SHX1557

## 2020-04-18 LAB — GLUCOSE, CAPILLARY: Glucose-Capillary: 133 mg/dL — ABNORMAL HIGH (ref 70–99)

## 2020-04-18 SURGERY — LITHOTRIPSY, ESWL
Anesthesia: LOCAL | Laterality: Right

## 2020-04-18 MED ORDER — SODIUM CHLORIDE 0.9 % IV SOLN: INTRAVENOUS | Status: DC

## 2020-04-18 MED ORDER — DIAZEPAM 5 MG PO TABS
ORAL_TABLET | ORAL | Status: AC
  Filled 2020-04-18: qty 2

## 2020-04-18 MED ORDER — DIAZEPAM 5 MG PO TABS
10.0000 mg | ORAL_TABLET | ORAL | Status: AC
  Administered 2020-04-18: 10 mg via ORAL

## 2020-04-18 MED ORDER — CIPROFLOXACIN HCL 500 MG PO TABS
500.0000 mg | ORAL_TABLET | ORAL | Status: AC
  Administered 2020-04-18: 500 mg via ORAL

## 2020-04-18 MED ORDER — DIPHENHYDRAMINE HCL 25 MG PO CAPS
ORAL_CAPSULE | ORAL | Status: AC
  Filled 2020-04-18: qty 1

## 2020-04-18 MED ORDER — DIPHENHYDRAMINE HCL 25 MG PO CAPS
25.0000 mg | ORAL_CAPSULE | ORAL | Status: AC
  Administered 2020-04-18: 25 mg via ORAL

## 2020-04-18 MED ORDER — CIPROFLOXACIN HCL 500 MG PO TABS
ORAL_TABLET | ORAL | Status: AC
  Filled 2020-04-18: qty 1

## 2020-04-18 NOTE — Discharge Instructions (Signed)
Lithotripsy, Care After This sheet gives you information about how to care for yourself after your procedure. Your health care provider may also give you more specific instructions. If you have problems or questions, contact your health care provider. What can I expect after the procedure? After the procedure, it is common to have:  Some blood in your urine. This should only last for a few days.  Soreness in your back, sides, or upper abdomen for a few days.  Blotches or bruises on your back where the pressure wave entered the skin.  Pain, discomfort, or nausea when pieces (fragments) of the kidney stone move through the tube that carries urine from the kidney to the bladder (ureter). Stone fragments may pass soon after the procedure, but they may continue to pass for up to 4-8 weeks. ? If you have severe pain or nausea, contact your health care provider. This may be caused by a large stone that was not broken up, and this may mean that you need more treatment.  Some pain or discomfort during urination.  Some pain or discomfort in the lower abdomen or (in men) at the base of the penis. Follow these instructions at home: Medicines  Take over-the-counter and prescription medicines only as told by your health care provider.  If you were prescribed an antibiotic medicine, take it as told by your health care provider. Do not stop taking the antibiotic even if you start to feel better.  Do not drive for 24 hours if you were given a medicine to help you relax (sedative).  Do not drive or use heavy machinery while taking prescription pain medicine. Eating and drinking      Drink enough water and fluids to keep your urine clear or pale yellow. This helps any remaining pieces of the stone to pass. It can also help prevent new stones from forming.  Eat plenty of fresh fruits and vegetables.  Follow instructions from your health care provider about eating and drinking restrictions. You may be  instructed: ? To reduce how much salt (sodium) you eat or drink. Check ingredients and nutrition facts on packaged foods and beverages. ? To reduce how much meat you eat.  Eat the recommended amount of calcium for your age and gender. Ask your health care provider how much calcium you should have. General instructions  Get plenty of rest.  Most people can resume normal activities 1-2 days after the procedure. Ask your health care provider what activities are safe for you.  Your health care provider may direct you to lie in a certain position (postural drainage) and tap firmly (percuss) over your kidney area to help stone fragments pass. Follow instructions as told by your health care provider.  If directed, strain all urine through the strainer that was provided by your health care provider. ? Keep all fragments for your health care provider to see. Any stones that are found may be sent to a medical lab for examination. The stone may be as small as a grain of salt.  Keep all follow-up visits as told by your health care provider. This is important. Contact a health care provider if:  You have pain that is severe or does not get better with medicine.  You have nausea that is severe or does not go away.  You have blood in your urine longer than your health care provider told you to expect.  You have more blood in your urine.  You have pain during urination that does   not go away.  You urinate more frequently than usual and this does not go away.  You develop a rash or any other possible signs of an allergic reaction. Get help right away if:  You have severe pain in your back, sides, or upper abdomen.  You have severe pain while urinating.  Your urine is very dark red.  You have blood in your stool (feces).  You cannot pass any urine at all.  You feel a strong urge to urinate after emptying your bladder.  You have a fever or chills.  You develop shortness of breath,  difficulty breathing, or chest pain.  You have severe nausea that leads to persistent vomiting.  You faint. Summary  After this procedure, it is common to have some pain, discomfort, or nausea when pieces (fragments) of the kidney stone move through the tube that carries urine from the kidney to the bladder (ureter). If this pain or nausea is severe, however, you should contact your health care provider.  Most people can resume normal activities 1-2 days after the procedure. Ask your health care provider what activities are safe for you.  Drink enough water and fluids to keep your urine clear or pale yellow. This helps any remaining pieces of the stone to pass, and it can help prevent new stones from forming.  If directed, strain your urine and keep all fragments for your health care provider to see. Fragments or stones may be as small as a grain of salt.  Get help right away if you have severe pain in your back, sides, or upper abdomen or have severe pain while urinating. This information is not intended to replace advice given to you by your health care provider. Make sure you discuss any questions you have with your health care provider. Document Revised: 09/22/2018 Document Reviewed: 05/02/2016 Elsevier Patient Education  2020 [employer]vier Inc.  

## 2020-04-18 NOTE — Op Note (Signed)
Right distal 10 mm stone   Right ESWL   Findings: stone faded but did not disappear. He may need a staged procedure if he fails to pass the stone and or fragments. He tolerated the procedure well.

## 2020-04-18 NOTE — Interval H&P Note (Signed)
History and Physical Interval Note:  04/18/2020 10:20 AM  Micheal Chavez  has presented today for surgery, with the diagnosis of RIGHT DISTAL URETERAL STONE.  The various methods of treatment have been discussed with the patient and family. After consideration of risks, benefits and other options for treatment, the patient has consented to  Procedure(s): EXTRACORPOREAL SHOCK WAVE LITHOTRIPSY (ESWL) (Right) as a surgical intervention.  The patient's history has been reviewed, patient examined, no change in status, stable for surgery. KUB with stable 9-10 mm right distal stone. Pt without stone passage. No fever, dysuria or gross hematuria. I have reviewed the patient's chart and labs.  Questions were answered to the patient's satisfaction.     Jerilee Field

## 2020-04-19 ENCOUNTER — Encounter (HOSPITAL_BASED_OUTPATIENT_CLINIC_OR_DEPARTMENT_OTHER): Payer: Self-pay | Admitting: Urology

## 2020-04-22 ENCOUNTER — Encounter (HOSPITAL_BASED_OUTPATIENT_CLINIC_OR_DEPARTMENT_OTHER): Payer: Self-pay | Admitting: Urology

## 2020-04-25 DIAGNOSIS — N201 Calculus of ureter: Secondary | ICD-10-CM | POA: Diagnosis not present

## 2020-04-27 ENCOUNTER — Other Ambulatory Visit: Payer: Self-pay

## 2020-04-27 ENCOUNTER — Encounter (HOSPITAL_BASED_OUTPATIENT_CLINIC_OR_DEPARTMENT_OTHER): Payer: Self-pay | Admitting: General Surgery

## 2020-04-27 NOTE — Progress Notes (Signed)
Spoke w/ via phone for pre-op interview---pt Lab needs dos---- I stat 8              COVID test ------+ covid 02-27-2020 [facility] no covid test needed Arrive at -------530 am 05-05-2020 NPO after MN NO Solid Food.  Clear liquids from MN until---530 am then npo Medications to take morning of surgery -----tamsulosin, atorvastatin Diabetic medication -----none day of surgery Patient Special Instructions -----bring cpap mask tubing and machine Pre-Op special Istructions -----none Patient verbalized understanding of instructions that were given at this phone interview. Patient denies shortness of breath, chest pain, fever, cough at this phone interview.  Anesthesia Review: no  PCP: dr mark perini Cardiologist :dr Myra Gianotti andrew quinn np 08-06-2019 epic Chest x-ray :none EKG :08-17-2019 Echo :none Stress test:07-31-2011 epic Cardiac Cath : 12-25-2016 epic Activity level: can climb flight of steps with out ptoblems Sleep Study/ CPAP : uses every night does not know cpap settings Fasting Blood Sugar :      / Checks Blood Sugar -- times a day:   Blood Thinner/ Instructions /Last Dose:n/a ASA / Instructions/ Last Dose : pt planes to stop 81 mg aspirin on own 3 days before surgery

## 2020-05-02 ENCOUNTER — Other Ambulatory Visit (HOSPITAL_COMMUNITY): Payer: BC Managed Care – PPO

## 2020-05-05 ENCOUNTER — Ambulatory Visit (HOSPITAL_BASED_OUTPATIENT_CLINIC_OR_DEPARTMENT_OTHER): Payer: BC Managed Care – PPO | Admitting: Anesthesiology

## 2020-05-05 ENCOUNTER — Other Ambulatory Visit: Payer: Self-pay

## 2020-05-05 ENCOUNTER — Ambulatory Visit (HOSPITAL_BASED_OUTPATIENT_CLINIC_OR_DEPARTMENT_OTHER)
Admission: RE | Admit: 2020-05-05 | Discharge: 2020-05-05 | Disposition: A | Discharge status: Home / Self Care | Payer: BC Managed Care – PPO | Attending: General Surgery | Admitting: General Surgery

## 2020-05-05 ENCOUNTER — Encounter (HOSPITAL_BASED_OUTPATIENT_CLINIC_OR_DEPARTMENT_OTHER)
Admission: RE | Disposition: A | Discharge status: Home / Self Care | Payer: Self-pay | Source: Home / Self Care | Attending: General Surgery

## 2020-05-05 ENCOUNTER — Encounter (HOSPITAL_BASED_OUTPATIENT_CLINIC_OR_DEPARTMENT_OTHER): Payer: Self-pay | Admitting: General Surgery

## 2020-05-05 DIAGNOSIS — K642 Third degree hemorrhoids: Secondary | ICD-10-CM | POA: Diagnosis not present

## 2020-05-05 DIAGNOSIS — Z79899 Other long term (current) drug therapy: Secondary | ICD-10-CM | POA: Insufficient documentation

## 2020-05-05 DIAGNOSIS — Z7982 Long term (current) use of aspirin: Secondary | ICD-10-CM | POA: Diagnosis not present

## 2020-05-05 DIAGNOSIS — Z955 Presence of coronary angioplasty implant and graft: Secondary | ICD-10-CM | POA: Diagnosis not present

## 2020-05-05 DIAGNOSIS — Z87891 Personal history of nicotine dependence: Secondary | ICD-10-CM | POA: Insufficient documentation

## 2020-05-05 DIAGNOSIS — D649 Anemia, unspecified: Secondary | ICD-10-CM | POA: Insufficient documentation

## 2020-05-05 DIAGNOSIS — I2510 Atherosclerotic heart disease of native coronary artery without angina pectoris: Secondary | ICD-10-CM | POA: Diagnosis not present

## 2020-05-05 DIAGNOSIS — I10 Essential (primary) hypertension: Secondary | ICD-10-CM | POA: Diagnosis not present

## 2020-05-05 DIAGNOSIS — Z7984 Long term (current) use of oral hypoglycemic drugs: Secondary | ICD-10-CM | POA: Insufficient documentation

## 2020-05-05 DIAGNOSIS — K641 Second degree hemorrhoids: Secondary | ICD-10-CM | POA: Diagnosis not present

## 2020-05-05 HISTORY — DX: Personal history of urinary calculi: Z87.442

## 2020-05-05 HISTORY — PX: TRANSANAL HEMORRHOIDAL DEARTERIALIZATION: SHX6136

## 2020-05-05 HISTORY — DX: Type 2 diabetes mellitus without complications: E11.9

## 2020-05-05 LAB — POCT I-STAT, CHEM 8
BUN: 13 mg/dL (ref 6–20)
Calcium, Ion: 1.32 mmol/L (ref 1.15–1.40)
Chloride: 99 mmol/L (ref 98–111)
Creatinine, Ser: 0.9 mg/dL (ref 0.61–1.24)
Glucose, Bld: 152 mg/dL — ABNORMAL HIGH (ref 70–99)
HCT: 42 % (ref 39.0–52.0)
Hemoglobin: 14.3 g/dL (ref 13.0–17.0)
Potassium: 3.7 mmol/L (ref 3.5–5.1)
Sodium: 141 mmol/L (ref 135–145)
TCO2: 26 mmol/L (ref 22–32)

## 2020-05-05 LAB — GLUCOSE, CAPILLARY: Glucose-Capillary: 182 mg/dL — ABNORMAL HIGH (ref 70–99)

## 2020-05-05 SURGERY — TRANSANAL HEMORRHOIDAL DEARTERIALIZATION
Anesthesia: Monitor Anesthesia Care | Site: Rectum

## 2020-05-05 MED ORDER — CELECOXIB 200 MG PO CAPS
200.0000 mg | ORAL_CAPSULE | ORAL | Status: AC
  Administered 2020-05-05: 200 mg via ORAL

## 2020-05-05 MED ORDER — FENTANYL CITRATE (PF) 100 MCG/2ML IJ SOLN
INTRAMUSCULAR | Status: AC
  Filled 2020-05-05: qty 2

## 2020-05-05 MED ORDER — GABAPENTIN 300 MG PO CAPS
300.0000 mg | ORAL_CAPSULE | ORAL | Status: AC
  Administered 2020-05-05: 300 mg via ORAL

## 2020-05-05 MED ORDER — SODIUM CHLORIDE 0.9% FLUSH: 3.0000 mL | Freq: Two times a day (BID) | INTRAVENOUS | Status: DC

## 2020-05-05 MED ORDER — FENTANYL CITRATE (PF) 100 MCG/2ML IJ SOLN
INTRAMUSCULAR | Status: DC | PRN
  Administered 2020-05-05: 50 ug via INTRAVENOUS
  Administered 2020-05-05: 25 ug via INTRAVENOUS

## 2020-05-05 MED ORDER — ACETAMINOPHEN 500 MG PO TABS
ORAL_TABLET | ORAL | Status: AC
  Filled 2020-05-05: qty 2

## 2020-05-05 MED ORDER — MIDAZOLAM HCL 5 MG/5ML IJ SOLN
INTRAMUSCULAR | Status: DC | PRN
  Administered 2020-05-05: 2 mg via INTRAVENOUS

## 2020-05-05 MED ORDER — PROPOFOL 10 MG/ML IV BOLUS
INTRAVENOUS | Status: DC | PRN
  Administered 2020-05-05: 30 mg via INTRAVENOUS

## 2020-05-05 MED ORDER — ONDANSETRON HCL 4 MG/2ML IJ SOLN
INTRAMUSCULAR | Status: AC
  Filled 2020-05-05: qty 2

## 2020-05-05 MED ORDER — MIDAZOLAM HCL 2 MG/2ML IJ SOLN
INTRAMUSCULAR | Status: AC
  Filled 2020-05-05: qty 2

## 2020-05-05 MED ORDER — DEXMEDETOMIDINE (PRECEDEX) IN NS 20 MCG/5ML (4 MCG/ML) IV SYRINGE
PREFILLED_SYRINGE | INTRAVENOUS | Status: DC | PRN
  Administered 2020-05-05: 12 ug via INTRAVENOUS

## 2020-05-05 MED ORDER — 0.9 % SODIUM CHLORIDE (POUR BTL) OPTIME
TOPICAL | Status: DC | PRN
  Administered 2020-05-05: 500 mL

## 2020-05-05 MED ORDER — BUPIVACAINE LIPOSOME 1.3 % IJ SUSP
INTRAMUSCULAR | Status: DC | PRN
  Administered 2020-05-05: 20 mL

## 2020-05-05 MED ORDER — PROPOFOL 10 MG/ML IV BOLUS
INTRAVENOUS | Status: AC
  Filled 2020-05-05: qty 20

## 2020-05-05 MED ORDER — ACETAMINOPHEN 500 MG PO TABS
1000.0000 mg | ORAL_TABLET | ORAL | Status: AC
  Administered 2020-05-05: 1000 mg via ORAL

## 2020-05-05 MED ORDER — OXYCODONE HCL 5 MG PO TABS: 5.0000 mg | ORAL_TABLET | ORAL | Status: DC | PRN

## 2020-05-05 MED ORDER — LACTATED RINGERS IV SOLN: INTRAVENOUS | Status: DC

## 2020-05-05 MED ORDER — SODIUM CHLORIDE 0.9% FLUSH: 3.0000 mL | INTRAVENOUS | Status: DC | PRN

## 2020-05-05 MED ORDER — LIDOCAINE 2% (20 MG/ML) 5 ML SYRINGE
INTRAMUSCULAR | Status: AC
  Filled 2020-05-05: qty 5

## 2020-05-05 MED ORDER — DEXAMETHASONE SODIUM PHOSPHATE 10 MG/ML IJ SOLN
INTRAMUSCULAR | Status: DC | PRN
  Administered 2020-05-05: 5 mg via INTRAVENOUS

## 2020-05-05 MED ORDER — BUPIVACAINE LIPOSOME 1.3 % IJ SUSP: 20.0000 mL | Freq: Once | INTRAMUSCULAR | Status: DC

## 2020-05-05 MED ORDER — BUPIVACAINE-EPINEPHRINE 0.5% -1:200000 IJ SOLN
INTRAMUSCULAR | Status: DC | PRN
  Administered 2020-05-05: 30 mL

## 2020-05-05 MED ORDER — ACETAMINOPHEN 325 MG PO TABS: 650.0000 mg | ORAL_TABLET | ORAL | Status: DC | PRN

## 2020-05-05 MED ORDER — SODIUM CHLORIDE 0.9 % IV SOLN: 250.0000 mL | INTRAVENOUS | Status: DC | PRN

## 2020-05-05 MED ORDER — CELECOXIB 200 MG PO CAPS
ORAL_CAPSULE | ORAL | Status: AC
  Filled 2020-05-05: qty 1

## 2020-05-05 MED ORDER — ACETAMINOPHEN 325 MG RE SUPP: 650.0000 mg | RECTAL | Status: DC | PRN

## 2020-05-05 MED ORDER — OXYCODONE HCL 5 MG PO TABS
5.0000 mg | ORAL_TABLET | Freq: Four times a day (QID) | ORAL | 0 refills | Status: DC | PRN
Start: 2020-05-05 — End: 2020-10-18

## 2020-05-05 MED ORDER — DEXMEDETOMIDINE (PRECEDEX) IN NS 20 MCG/5ML (4 MCG/ML) IV SYRINGE
PREFILLED_SYRINGE | INTRAVENOUS | Status: AC
  Filled 2020-05-05: qty 5

## 2020-05-05 MED ORDER — ONDANSETRON HCL 4 MG/2ML IJ SOLN
INTRAMUSCULAR | Status: DC | PRN
  Administered 2020-05-05: 4 mg via INTRAVENOUS

## 2020-05-05 MED ORDER — GABAPENTIN 300 MG PO CAPS
ORAL_CAPSULE | ORAL | Status: AC
  Filled 2020-05-05: qty 1

## 2020-05-05 MED ORDER — DEXAMETHASONE SODIUM PHOSPHATE 10 MG/ML IJ SOLN
INTRAMUSCULAR | Status: AC
  Filled 2020-05-05: qty 1

## 2020-05-05 MED ORDER — PROPOFOL 500 MG/50ML IV EMUL
INTRAVENOUS | Status: DC | PRN
  Administered 2020-05-05: 200 ug/kg/min via INTRAVENOUS

## 2020-05-05 MED ORDER — PROPOFOL 500 MG/50ML IV EMUL
INTRAVENOUS | Status: AC
  Filled 2020-05-05: qty 50

## 2020-05-05 SURGICAL SUPPLY — 37 items
BLADE HEX COATED 2.75 (ELECTRODE) IMPLANT
BRIEF STRETCH FOR OB PAD LRG (UNDERPADS AND DIAPERS) IMPLANT
COVER BACK TABLE 60X90IN (DRAPES) ×3 IMPLANT
COVER WAND RF STERILE (DRAPES) ×3 IMPLANT
DECANTER SPIKE VIAL GLASS SM (MISCELLANEOUS) IMPLANT
DRAPE HYSTEROSCOPY (MISCELLANEOUS) ×3 IMPLANT
DRAPE SHEET LG 3/4 BI-LAMINATE (DRAPES) ×3 IMPLANT
DRSG PAD ABDOMINAL 8X10 ST (GAUZE/BANDAGES/DRESSINGS) ×3 IMPLANT
ELECT REM PT RETURN 9FT ADLT (ELECTROSURGICAL) ×3
ELECTRODE REM PT RTRN 9FT ADLT (ELECTROSURGICAL) ×1 IMPLANT
GAUZE SPONGE 4X4 12PLY STRL (GAUZE/BANDAGES/DRESSINGS) IMPLANT
GAUZE SPONGE 4X4 12PLY STRL LF (GAUZE/BANDAGES/DRESSINGS) ×3 IMPLANT
GLOVE BIO SURGEON STRL SZ 6.5 (GLOVE) ×2 IMPLANT
GLOVE BIO SURGEONS STRL SZ 6.5 (GLOVE) ×1
GLOVE BIOGEL PI IND STRL 7.0 (GLOVE) ×1 IMPLANT
GLOVE BIOGEL PI INDICATOR 7.0 (GLOVE) ×2
GOWN STRL REUS W/TWL XL LVL3 (GOWN DISPOSABLE) ×3 IMPLANT
HEMOSTAT SURGICEL 4X8 (HEMOSTASIS) IMPLANT
KIT SIGMOIDOSCOPE (SET/KITS/TRAYS/PACK) IMPLANT
KIT SLIDE ONE PROLAPS HEMORR (KITS) ×3 IMPLANT
KIT TURNOVER CYSTO (KITS) ×3 IMPLANT
LEGGING LITHOTOMY PAIR STRL (DRAPES) ×3 IMPLANT
LUBRICANT JELLY K Y 4OZ (MISCELLANEOUS) ×3 IMPLANT
NEEDLE HYPO 22GX1.5 SAFETY (NEEDLE) ×3 IMPLANT
PACK BASIN DAY SURGERY FS (CUSTOM PROCEDURE TRAY) ×3 IMPLANT
PAD ARMBOARD 7.5X6 YLW CONV (MISCELLANEOUS) IMPLANT
PENCIL SMOKE EVACUATOR (MISCELLANEOUS) ×3 IMPLANT
SPONGE HEMORRHOID 8X3CM (HEMOSTASIS) IMPLANT
SUT CHROMIC 2 0 SH (SUTURE) IMPLANT
SUT CHROMIC 3 0 SH 27 (SUTURE) IMPLANT
SUT VIC AB 2-0 UR6 27 (SUTURE) IMPLANT
SYR CONTROL 10ML LL (SYRINGE) ×3 IMPLANT
TOWEL OR 17X26 10 PK STRL BLUE (TOWEL DISPOSABLE) ×3 IMPLANT
TRAY DSU PREP LF (CUSTOM PROCEDURE TRAY) ×3 IMPLANT
TUBE CONNECTING 12'X1/4 (SUCTIONS) ×1
TUBE CONNECTING 12X1/4 (SUCTIONS) ×2 IMPLANT
YANKAUER SUCT BULB TIP NO VENT (SUCTIONS) ×3 IMPLANT

## 2020-05-05 NOTE — Transfer of Care (Signed)
Immediate Anesthesia Transfer of Care Note  Patient: Micheal Chavez  Procedure(s) Performed: TRANSANAL HEMORRHOIDAL DEARTERIALIZATION (N/A Rectum)  Patient Location: PACU  Anesthesia Type:MAC  Level of Consciousness: awake, alert , oriented and patient cooperative  Airway & Oxygen Therapy: Patient Spontanous Breathing  Post-op Assessment: Report given to RN and Post -op Vital signs reviewed and stable  Post vital signs: Reviewed and stable  Last Vitals:  Vitals Value Taken Time  BP 119/73 05/05/20 0925  Temp    Pulse 74 05/05/20 0928  Resp 27 05/05/20 0928  SpO2 94 % 05/05/20 0928  Vitals shown include unvalidated device data.  Last Pain:  Vitals:   05/05/20 0701  TempSrc: Oral  PainSc: 0-No pain      Patients Stated Pain Goal: 6 (82/95/62 1308)  Complications: No complications documented.

## 2020-05-05 NOTE — Op Note (Signed)
05/05/2020  9:15 AM  PATIENT:  Micheal Chavez  59 y.o. male  Patient Care Team: Crist Infante, MD as PCP - General (Internal Medicine) Josue Hector, MD as PCP - Cardiology (Cardiology)  PRE-OPERATIVE DIAGNOSIS:  RECTAL BLEEDING , GRADE 3 HEMORRHOIDS  POST-OPERATIVE DIAGNOSIS:  RECTAL BLEEDING , GRADE 3 HEMORRHOIDS  PROCEDURE: TRANSANAL HEMORRHOIDAL DEARTERIALIZATION    Surgeon(s): Leighton Ruff, MD  ASSISTANT: none   ANESTHESIA:   local and MAC  EBL:  No intake/output data recorded.  DRAINS: none   SPECIMEN:  No Specimen  DISPOSITION OF SPECIMEN:  N/A  COUNTS:  YES  PLAN OF CARE: Discharge to home after PACU  PATIENT DISPOSITION:  PACU - hemodynamically stable.  INDICATION: 59 y.o. M with rectal bleeding and grade 3 hemorrhoids   OR FINDINGS: Grade 3 RP internal hemorrhoid, grade 2 L lateral internal hemorrhoid  Description: Informed consent was confirmed. Patient underwent general anesthesia without difficulty. Patient was placed into lithotomy positioning.  The perianal region was prepped and draped in sterile fashion. Surgical time out confirmed or plan.  I did digital rectal examination and then transitioned over to anoscopy to get a sense of the anatomy.  I switched over to the [facility] fiberoptically lit Doppler anocope.   Using the Doppler on the tip of the [place] anoscope, I identified the arterial hemorrhoidal vessels coming in in the classic hexagonal anatomical pattern  (right posterior/lateral/anterior, left posterior /lateral/anterior).    I proceeded to ligate the hemorrhoidal arteries. I used a 2-0 Vicryl suture on a UR-6 needle in a figure-of-eight fashion over the signal around 6 cm proximal to the anal verge. I then ran that stitch longitudinally more distally to the dentate line. I then tied that stitch down to cause a hemorrhoidopexy. I did that for all 6 locations.    I redid Doppler anoscopy. I Identified a signal at the R lateral location.  I  isolated and ligated this with a figure-of-eight stitch. Signals went away.  At completion of this, all hemorrhoids were reduced into the rectum.  There is no more prolapse. Some external hemorrhoids still present but non-inflamed.  I repeated anoscopy and examination.   Hemostasis was good.  Patient is being extubated go to recovery room.  I am about to discuss the patient's status to the family.

## 2020-05-05 NOTE — Anesthesia Postprocedure Evaluation (Signed)
Anesthesia Post Note  Patient: Micheal Chavez  Procedure(s) Performed: TRANSANAL HEMORRHOIDAL DEARTERIALIZATION (N/A Rectum)     Patient location during evaluation: PACU Anesthesia Type: MAC Level of consciousness: awake and alert Pain management: pain level controlled Vital Signs Assessment: post-procedure vital signs reviewed and stable Respiratory status: spontaneous breathing Cardiovascular status: stable Anesthetic complications: no   No complications documented.  Last Vitals:  Vitals:   05/05/20 1000 05/05/20 1030  BP:  (!) 141/86  Pulse: (!) 57 66  Resp: 13 14  Temp:  36.6 C  SpO2: 93% 98%    Last Pain:  Vitals:   05/05/20 1030  TempSrc:   PainSc: 0-No pain                 Nolon Nations

## 2020-05-05 NOTE — Discharge Instructions (Addendum)
ANORECTAL SURGERY: POST OP INSTRUCTIONS 1. Take your usually prescribed home medications unless otherwise directed. 2. DIET: During the first few hours after surgery sip on some liquids until you are able to urinate.  It is normal to not urinate for several hours after this surgery.  If you feel uncomfortable, please contact the office for instructions.  After you are able to urinate,you may eat, if you feel like it.  Follow a light bland diet the first 24 hours after arrival home, such as soup, liquids, crackers, etc.  Be sure to include lots of fluids daily (6-8 glasses).  Avoid fast food or heavy meals, as your are more likely to get nauseated.  Eat a low fat diet the next few days after surgery.  Limit caffeine intake to 1-2 servings a day. 3. PAIN CONTROL: a. Pain is best controlled by a usual combination of several different methods TOGETHER: i. Muscle relaxation: Soak in a warm bath (or Sitz bath) three times a day and after bowel movements.  Continue to do this until all pain is resolved. ii. Over the counter pain medication iii. Prescription pain medication b. Most patients will experience some swelling and discomfort in the anus/rectal area and incisions.  Heat such as warm towels, sitz baths, warm baths, etc to help relax tight/sore spots and speed recovery.  Some people prefer to use ice, especially in the first couple days after surgery, as it may decrease the pain and swelling, or alternate between ice & heat.  Experiment to what works for you.  Swelling and bruising can take several weeks to resolve.  Pain can take even longer to completely resolve. c. It is helpful to take an over-the-counter pain medication regularly for the first few weeks.  Choose one of the following that works best for you: i. Naproxen (Aleve, etc)  Two 220mg tabs twice a day ii. Ibuprofen (Advil, etc) Three 200mg tabs four times a day (every meal & bedtime) d. A  prescription for pain medication (such as percocet,  oxycodone, hydrocodone, etc) should be given to you upon discharge.  Take your pain medication as prescribed.  i. If you are having problems/concerns with the prescription medicine (does not control pain, nausea, vomiting, rash, itching, etc), please call us [phone] to see if we need to switch you to a different pain medicine that will work better for you and/or control your side effect better. ii. If you need a refill on your pain medication, please contact your pharmacy.  They will contact our office to request authorization. Prescriptions will not be filled after 5 pm or on week-ends. 4. KEEP YOUR BOWELS REGULAR and AVOID CONSTIPATION a. The goal is one to two soft bowel movements a day.  You should at least have a bowel movement every other day. b. Avoid getting constipated.  Between the surgery and the pain medications, it is common to experience some constipation. This can be very painful after rectal surgery.  Increasing fluid intake and taking a fiber supplement (such as Metamucil, Citrucel, FiberCon, etc) 1-2 times a day regularly will usually help prevent this problem from occurring.  A stool softener like colace is also recommended.  This can be purchased over the counter at your pharmacy.  You can take it up to 3 times a day.  If you do not have a bowel movement after 24 hrs since your surgery, take one does of milk of magnesia.  If you still haven't had a bowel movement 8-12 hours after   that dose, take another dose.  If you don't have a bowel movement 48 hrs after surgery, purchase a Fleets enema from the drug store and administer gently per package instructions.  If you still are having trouble with your bowel movements after that, please call the office for further instructions. c. If you develop diarrhea or have many loose bowel movements, simplify your diet to bland foods & liquids for a few days.  Stop any stool softeners and decrease your fiber supplement.  Switching to mild  anti-diarrheal medications (Kayopectate, Pepto Bismol) can help.  If this worsens or does not improve, please call us.  5. Wound Care a. Remove your bandages before your first bowel movement or 8 hours after surgery.     b. Remove any wound packing material at this tim,e as well.  You do not need to repack the wound unless instructed otherwise.  Wear an absorbent pad or soft cotton gauze in your underwear to catch any drainage and help keep the area clean. You should change this every 2-3 hours while awake. c. Keep the area clean and dry.  Bathe / shower every day, especially after bowel movements.  Keep the area clean by showering / bathing over the incision / wound.   It is okay to soak an open wound to help wash it.  Wet wipes or showers / gentle washing after bowel movements is often less traumatic than regular toilet paper. d. You may have some styrofoam-like soft packing in the rectum which will come out with the first bowel movement.  e. You will often notice bleeding with bowel movements.  This should slow down by the end of the first week of surgery f. Expect some drainage.  This should slow down, too, by the end of the first week of surgery.  Wear an absorbent pad or soft cotton gauze in your underwear until the drainage stops. g. Do Not sit on a rubber or pillow ring.  This can make you symptoms worse.  You may sit on a soft pillow if needed.  6. ACTIVITIES as tolerated:   a. You may resume regular (light) daily activities beginning the next day--such as daily self-care, walking, climbing stairs--gradually increasing activities as tolerated.  If you can walk 30 minutes without difficulty, it is safe to try more intense activity such as jogging, treadmill, bicycling, low-impact aerobics, swimming, etc. b. Save the most intensive and strenuous activity for last such as sit-ups, heavy lifting, contact sports, etc  Refrain from any heavy lifting or straining until you are off narcotics for pain  control.   c. You may drive when you are no longer taking prescription pain medication, you can comfortably sit for long periods of time, and you can safely maneuver your car and apply brakes. d. You may have sexual intercourse when it is comfortable.  7. FOLLOW UP in our office a. Please call [facility] at [phone] to set up an appointment to see your surgeon in the office for a follow-up appointment approximately 3-4 weeks after your surgery. b. Make sure that you call for this appointment the day you arrive home to insure a convenient appointment time. 10. IF YOU HAVE DISABILITY OR FAMILY LEAVE FORMS, BRING THEM TO THE OFFICE FOR PROCESSING.  DO NOT GIVE THEM TO YOUR DOCTOR.     WHEN TO CALL US [phone]: 1. Poor pain control 2. Reactions / problems with new medications (rash/itching, nausea, etc)  3. Fever over 101.5 F (38.5 C) 4.   Inability to urinate 5. Nausea and/or vomiting 6. Worsening swelling or bruising 7. Continued bleeding from incision. 8. Increased pain, redness, or drainage from the incision  The clinic staff is available to answer your questions during regular business hours (8:30am-5pm).  Please don't hesitate to call and ask to speak to one of our nurses for clinical concerns.   A surgeon from [facility] is always on call at the hospitals   If you have a medical emergency, go to the nearest emergency room or call 911.    [facility] [address], [address], [place], [place]  [address] ? MAIN: [phone] ? TOLL FREE: [phone] ? FAX [phone] [email or web address]  Information for Discharge Teaching: EXPAREL (bupivacaine liposome injectable suspension)   Your surgeon or anesthesiologist gave you EXPAREL(bupivacaine) to help control your pain after surgery.   EXPAREL is a local anesthetic that provides pain relief by numbing the tissue around the surgical site.  EXPAREL is designed to release pain  medication over time and can control pain for up to 72 hours.  Depending on how you respond to EXPAREL, you may require less pain medication during your recovery.  Possible side effects:  Temporary loss of sensation or ability to move in the area where bupivacaine was injected.  Nausea, vomiting, constipation  Rarely, numbness and tingling in your mouth or lips, lightheadedness, or anxiety may occur.  Call your doctor right away if you think you may be experiencing any of these sensations, or if you have other questions regarding possible side effects.  Follow all other discharge instructions given to you by your surgeon or nurse. Eat a healthy diet and drink plenty of water or other fluids.  If you return to the hospital for any reason within 96 hours following the administration of EXPAREL, it is important for health care providers to know that you have received this anesthetic. A teal colored band has been placed on your arm with the date, time and amount of EXPAREL you have received in order to alert and inform your health care providers. Please leave this armband in place for the full 96 hours following administration, and then you may remove the band. Post Anesthesia Home Care Instructions  Activity: Get plenty of rest for the remainder of the day. A responsible individual must stay with you for 24 hours following the procedure.  For the next 24 hours, DO NOT: -Drive a car -Operate machinery -Drink alcoholic beverages -Take any medication unless instructed by your physician -Make any legal decisions or sign important papers.  Meals: Start with liquid foods such as gelatin or soup. Progress to regular foods as tolerated. Avoid greasy, spicy, heavy foods. If nausea and/or vomiting occur, drink only clear liquids until the nausea and/or vomiting subsides. Call your physician if vomiting continues.  Special Instructions/Symptoms: Your throat may feel dry or sore from the anesthesia  or the breathing tube placed in your throat during surgery. If this causes discomfort, gargle with warm salt water. The discomfort should disappear within 24 hours.  If you had a scopolamine patch placed behind your ear for the management of post- operative nausea and/or vomiting:  1. The medication in the patch is effective for 72 hours, after which it should be removed.  Wrap patch in a tissue and discard in the trash. Wash hands thoroughly with soap and water. 2. You may remove the patch earlier than 72 hours if you experience unpleasant side effects which may include dry   mouth, dizziness or visual disturbances. 3. Avoid touching the patch. Wash your hands with soap and water after contact with the patch.        

## 2020-05-05 NOTE — Anesthesia Preprocedure Evaluation (Addendum)
Anesthesia Evaluation  Patient identified by MRN, date of birth, ID band Patient awake    Reviewed: Allergy & Precautions, NPO status , Patient's Chart, lab work & pertinent test results  Airway Mallampati: II  TM Distance: >3 FB Neck ROM: Full    Dental  (+) Teeth Intact, Dental Advisory Given   Pulmonary sleep apnea and Continuous Positive Airway Pressure Ventilation , former smoker,    Pulmonary exam normal breath sounds clear to auscultation       Cardiovascular hypertension, Pt. on medications (-) angina+ CAD  Normal cardiovascular exam+ Valvular Problems/Murmurs  Rhythm:Regular Rate:Normal  LHC 12/2016  Mid RCA lesion, 0 %stenosed.  RPDA lesion, 25 %stenosed.  Prox Cx to Mid Cx lesion, 50 %stenosed.  Mid LAD lesion, 30 %stenosed.  Dist LAD lesion, 30 %stenosed.  The left ventricular systolic function is normal.  LV end diastolic pressure is normal.  The left ventricular ejection fraction is 55-65% by visual estimate.  There is no aortic valve stenosis.   Continue aggressive secondary prevention.  Decrease aspirin to 81 mg daily.      Neuro/Psych negative neurological ROS  negative psych ROS   GI/Hepatic negative GI ROS, Neg liver ROS,   Endo/Other  diabetes, Type 2, Oral Hypoglycemic Agents  Renal/GU negative Renal ROS  negative genitourinary   Musculoskeletal negative musculoskeletal ROS (+)   Abdominal   Peds negative pediatric ROS (+)  Hematology  (+) Blood dyscrasia, anemia ,   Anesthesia Other Findings   Reproductive/Obstetrics negative OB ROS                           Anesthesia Physical  Anesthesia Plan  ASA: III  Anesthesia Plan: MAC   Post-op Pain Management:    Induction: Intravenous  PONV Risk Score and Plan: 2 and Ondansetron, Dexamethasone, Treatment may vary due to age or medical condition, Propofol infusion, Midazolam and TIVA  Airway  Management Planned: Natural Airway  Additional Equipment: None  Intra-op Plan:   Post-operative Plan:   Informed Consent: I have reviewed the patients History and Physical, chart, labs and discussed the procedure including the risks, benefits and alternatives for the proposed anesthesia with the patient or authorized representative who has indicated his/her understanding and acceptance.     Dental advisory given  Plan Discussed with: CRNA  Anesthesia Plan Comments:        Anesthesia Quick Evaluation

## 2020-05-05 NOTE — H&P (Signed)
History of Present Illness  The patient is a 59 year old male who presents with a complaint of Rectal bleeding. A 59 year old male who presents to the office with recurrent bleeding internal hemorrhoids. He saw Dr. Kinnie Scales a couple years ago for similar symptoms. He underwent a colonoscopy in 2018. He also underwent hemorrhoid banding on 2 occasions but this did not help. He states that he has bleeding with almost every bowel movement now. He reports this is blood in the toilet bowl. He denies any blood in his stool. He currently takes a baby aspirin a day. He is having trouble with anemia. He takes iron supplements on a daily basis.      Past Medical History:  Diagnosis Date  . Anemia   . Borderline diabetes mellitus   . Coronary artery disease    Status post drug-eluting stent to the RCA 06/29/10. --  cath 06/29/10: LAD 30%; Cfx 30-50%; EF 60-65%  . Diabetes mellitus without complication (HCC)   . Dyslipidemia   . Glucose intolerance (impaired glucose tolerance)   . Hemorrhoids   . Hyperlipidemia   . Hypertension   . Obesity   . Previous back surgery   . Rupture of left distal biceps tendon   . Sleep apnea    Uses CPAP nightly        Past Surgical History:  Procedure Laterality Date  . BACK SURGERY     ruptured disc lower back  . CARDIAC CATHETERIZATION  06/29/2010   LAD 30%; Cfx 30-50%; EF 60-65%   . CHONDROPLASTY Right 03/14/2015   Procedure: PATELLO-FEMORAL, MEDIAL FEMORAL CONDYLE CHONDROPLASTY WITH MICROFRACTURE TECHNIQUE;  Surgeon: Jodi Geralds, MD;  Location: [facility];  Service: Orthopedics;  Laterality: Right;  . CORONARY ANGIOPLASTY WITH STENT PLACEMENT  06/29/2010   Successful percutaneous transluminal coronary angioplasty  with placement of a drug-eluting stent in the proximal right coronary  artery  . DISTAL BICEPS TENDON REPAIR Left 08/07/2019   Procedure: LEFT DISTAL BICEPS TENDON REPAIR;  Surgeon: Jodi Geralds, MD;   Location: [facility];  Service: Orthopedics;  Laterality: Left;  . EYE SURGERY Bilateral 2011,2013  . HEMORRHOID SURGERY     Status post hemorrhoid surgery x5  . KNEE ARTHROSCOPY WITH EXCISION PLICA Right 03/14/2015   Procedure: EXCISION MEDIAL PLICA;  Surgeon: Jodi Geralds, MD;  Location: [facility];  Service: Orthopedics;  Laterality: Right;  . KNEE ARTHROSCOPY WITH MEDIAL MENISECTOMY Right 03/14/2015   Procedure: RIGHT KNEE ARTHROSCOPY WITH PARTIAL MEDIAL MENISECTOMY;  Surgeon: Jodi Geralds, MD;  Location: [facility];  Service: Orthopedics;  Laterality: Right;  . LEFT HEART CATH AND CORONARY ANGIOGRAPHY N/A 12/25/2016   Procedure: Left Heart Cath and Coronary Angiography;  Surgeon: Corky Crafts, MD;  Location: [facility] IN[facility];  Service: Cardiovascular;  Laterality: N/A;   Review of Systems - General ROS: negative for - chills or fever Cardiovascular ROS: no chest pain or dyspnea on exertion Gastrointestinal ROS: no abdominal pain, change in bowel habits, or black or bloody stools Genito-Urinary ROS: no dysuria, trouble voiding, or hematuria   Allergies Laurette Schimke, RMA; 03/14/2020 3:20 PM) No Known Drug Allergies [05/11/2014]: Allergies Reconciled  Medication History Laurette Schimke, [place]; 03/14/2020 3:20 PM) Co Q10 (200MG  Capsule, Oral) Active. Chlorthalidone (15MG  Tablet, Oral) Active. Irbesartan (300MG  Tablet, Oral) Active. Aspirin (81MG  Tablet Chewable, Oral) Active. Lipitor (80MG  Tablet, Oral) Active. Doxazosin Mesylate (2MG  Tablet, Oral) Active. metFORMIN HCl (1000MG  Tablet, Oral) Active. Medications Reconciled  BP 138/78  Pulse 69   Temp 98.5 F (36.9 C) (Oral)   Resp 16   Ht 5\' 11"  (1.803 m)   Wt 121.9 kg   SpO2 99%   BMI 37.48 kg/m      Physical Exam   General Mental Status-Alert. General Appearance-Cooperative. CV:RRR Lungs: CTA Abdomen Palpation/Percussion Palpation and  Percussion of the abdomen reveal - Soft and Non Tender.  Rectal Note: Significant skin tags and external disease present.    Date ANOSCOPY, DIAGNOSTIC ) [ Hemorrhoids ] Procedure Other: Procedure: Anoscopy....([id number] KitchenSurgeon: Marland Kitchen....Maisie FusMarland KitchenAfter the risks and benefits were explained, verbal consent was obtained for above procedure. A medical assistant chaperone was present thoroughout the entire procedure. ....Marland KitchenMarland KitchenAnesthesia: none....Marland KitchenMarland KitchenDiagnosis: rectal bleeding....Marland KitchenMarland KitchenFindings: Grade 3 left lateral internal hemorrhoid with signs of inflammation and active bleeding. Grade 2 right posterior and right anterior hemorrhoids.  Performed: 03/14/2020 4:04 PM    Assessment & Plan   BLEEDING INTERNAL HEMORRHOIDS (K64.8) Impression: 59 year old male who presented to the office for evaluation of internal hemorrhoids. On exam , he has a significantly inflamed left sided internal hemorrhoid, grade 3. I think this is the source of his rectal bleeding. The patient thinks that he is also due for a colonoscopy. We discussed performing a trans-hemorrhoidal dearterialization and a couple months and then performing colonoscopy after he heals from this. We have discussed this procedure in detail including risks, benefits and alternatives. We discussed the chances of this resolving his bleeding. All questions were answered.

## 2020-05-06 ENCOUNTER — Encounter (HOSPITAL_BASED_OUTPATIENT_CLINIC_OR_DEPARTMENT_OTHER): Payer: Self-pay | Admitting: General Surgery

## 2020-05-23 DIAGNOSIS — N201 Calculus of ureter: Secondary | ICD-10-CM | POA: Diagnosis not present

## 2020-05-24 DIAGNOSIS — H04123 Dry eye syndrome of bilateral lacrimal glands: Secondary | ICD-10-CM | POA: Diagnosis not present

## 2020-05-24 DIAGNOSIS — H35363 Drusen (degenerative) of macula, bilateral: Secondary | ICD-10-CM | POA: Diagnosis not present

## 2020-05-24 DIAGNOSIS — Z961 Presence of intraocular lens: Secondary | ICD-10-CM | POA: Diagnosis not present

## 2020-05-24 DIAGNOSIS — E119 Type 2 diabetes mellitus without complications: Secondary | ICD-10-CM | POA: Diagnosis not present

## 2020-10-17 ENCOUNTER — Telehealth: Payer: Self-pay | Admitting: Physician Assistant

## 2020-10-17 DIAGNOSIS — I10 Essential (primary) hypertension: Secondary | ICD-10-CM | POA: Diagnosis not present

## 2020-10-17 DIAGNOSIS — R82998 Other abnormal findings in urine: Secondary | ICD-10-CM | POA: Diagnosis not present

## 2020-10-17 DIAGNOSIS — Z1331 Encounter for screening for depression: Secondary | ICD-10-CM | POA: Diagnosis not present

## 2020-10-17 DIAGNOSIS — E1169 Type 2 diabetes mellitus with other specified complication: Secondary | ICD-10-CM | POA: Diagnosis not present

## 2020-10-17 DIAGNOSIS — Z125 Encounter for screening for malignant neoplasm of prostate: Secondary | ICD-10-CM | POA: Diagnosis not present

## 2020-10-17 DIAGNOSIS — E291 Testicular hypofunction: Secondary | ICD-10-CM | POA: Diagnosis not present

## 2020-10-17 DIAGNOSIS — Z0000 Encounter for general adult medical examination without abnormal findings: Secondary | ICD-10-CM | POA: Diagnosis not present

## 2020-10-17 DIAGNOSIS — E785 Hyperlipidemia, unspecified: Secondary | ICD-10-CM | POA: Diagnosis not present

## 2020-10-17 NOTE — Telephone Encounter (Signed)
Pt is currently at his PCP office.  Is not felt Pt needs to go to ER.   Appt made for tomorrow with Vin.  Requested PCP note be faxed to our office for appt scheduled tomorrow.

## 2020-10-17 NOTE — Telephone Encounter (Signed)
Pt c/o of Chest Pain: STAT if CP now or developed within 24 hours  1. Are you having CP right now? No, just SOB  2. Are you experiencing any other symptoms (ex. SOB, nausea, vomiting, sweating)? SOB & fatigue   3. How long have you been experiencing CP? Past month   4. Is your CP continuous or coming and going? Coming and going   5. Have you taken Nitroglycerin? Yes   Pt c/o Shortness Of Breath: STAT if SOB developed within the last 24 hours or pt is noticeably SOB on the phone  1. Are you currently SOB (can you hear that pt is SOB on the phone)? Yes  2. How long have you been experiencing SOB? Past month   3. Are you SOB when sitting or when up moving around? More so when exerting self   4. Are you currently experiencing any other symptoms? Fatigue   Does not seem ER worthy per Dr. Waynard Edwards. Pt has been scheduled to see [facility] tomorrow.  ?

## 2020-10-18 ENCOUNTER — Ambulatory Visit (INDEPENDENT_AMBULATORY_CARE_PROVIDER_SITE_OTHER): Payer: BC Managed Care – PPO | Admitting: Physician Assistant

## 2020-10-18 ENCOUNTER — Other Ambulatory Visit: Payer: Self-pay

## 2020-10-18 ENCOUNTER — Encounter: Payer: Self-pay | Admitting: Physician Assistant

## 2020-10-18 VITALS — BP 130/80 | HR 93 | Ht 71.0 in | Wt 277.6 lb

## 2020-10-18 DIAGNOSIS — I200 Unstable angina: Secondary | ICD-10-CM

## 2020-10-18 DIAGNOSIS — I10 Essential (primary) hypertension: Secondary | ICD-10-CM

## 2020-10-18 DIAGNOSIS — I471 Supraventricular tachycardia: Secondary | ICD-10-CM

## 2020-10-18 DIAGNOSIS — I2510 Atherosclerotic heart disease of native coronary artery without angina pectoris: Secondary | ICD-10-CM

## 2020-10-18 DIAGNOSIS — R0600 Dyspnea, unspecified: Secondary | ICD-10-CM

## 2020-10-18 DIAGNOSIS — E785 Hyperlipidemia, unspecified: Secondary | ICD-10-CM | POA: Diagnosis not present

## 2020-10-18 DIAGNOSIS — R0609 Other forms of dyspnea: Secondary | ICD-10-CM

## 2020-10-18 MED ORDER — FUROSEMIDE 20 MG PO TABS: ORAL_TABLET | ORAL | 1 refills | Status: DC

## 2020-10-18 MED ORDER — POTASSIUM CHLORIDE ER 10 MEQ PO TBCR: EXTENDED_RELEASE_TABLET | ORAL | 1 refills | Status: DC

## 2020-10-18 NOTE — Progress Notes (Signed)
Cardiology Office Note:    Date:  10/18/2020   ID:  Micheal Chavez, DOB 1960/06/29, MRN [id number]  PCP:  Rodrigo Ran, MD  [facility] Cardiologist:  Charlton Haws, MD  [facility] Electrophysiologist:  None   Chief Complaint: SOB  History of Present Illness:    Micheal Chavez is a 60 y.o. male with a hx of DM, HLD, HTN, CAD, OSA on CPAP added to my schedule to SOB.   History of CAD s/p stenting to RCA January 2012.  He previously failed Plavix therapy and started on Effient.  History of angina in 2018 in setting of anemia.  Last cardiac cath July 2018 showed patent RCA stent and 50% residual circumflex disease.  Recommended medical therapy.  Has chronic anemia secondary to hemorrhoidal bleeding.  Occasionally requires iron infusion.  Patient was doing well when last seen February 2021.  Seen by Dr. Sherrye Payor yesterday for shortness of breath which was concerning for angina and added to my schedule. Blood work 10/17/2020 CMP: Creatinine 0.9, sodium 137, potassium 4, albumin 3.5, AST 31, ALT 57, CBC: WBC 7.21, RBC 4.4, hemoglobin 13, platelets 254 Lipid panel: Cholesterol 94, LDL of 113, HDL 23, LDL 48 PSA: Normal Vitamin D: Within normal limits Hemoglobin A1c: 7.2  Patient presented with his wife for evaluation of shortness of breath with activity.  He walks 2 to 3 miles few days per week.  After walking half of distance he gets shortness of breath.  He becomes more winded while walking uphill.  He used to do yard work without any problem last year but this year he has to stop what he does.  He has no associated chest tightness with activity.  However, at rest he gets intermittent sharp pain at the left upper chest.  No associated shortness of breath or palpitation at that time.  He is dyspnea on exertion ongoing for greater than 4 months but resting sharp chest pain started 1 month ago.  Patient denies excess salt in diet however asking further question it was determined that he is  eating very high salt diet.  Has mild lower extremity edema without orthopnea and PND.  Past Medical History:  Diagnosis Date  . Anemia   . Coronary artery disease    Status post drug-eluting stent to the RCA 06/29/10. --  cath 06/29/10: LAD 30%; Cfx 30-50%; EF 60-65%  . DM type 2 (diabetes mellitus, type 2) (HCC)   . Dyslipidemia   . Glucose intolerance (impaired glucose tolerance)   . Hemorrhoids   . History of kidney stones   . Hyperlipidemia   . Hypertension   . Obesity   . Previous back surgery   . Rupture of left distal biceps tendon   . Sleep apnea    Uses CPAP nightly    Past Surgical History:  Procedure Laterality Date  . BACK SURGERY  , Vanessa Kick [id number] last done    ruptured disc lower back  . CARDIAC CATHETERIZATION  06/29/2010   LAD 30%; Cfx 30-50%; EF 60-65%   . CHONDROPLASTY Right 03/14/2015   Procedure: PATELLO-FEMORAL, MEDIAL FEMORAL CONDYLE CHONDROPLASTY WITH MICROFRACTURE TECHNIQUE;  Surgeon: Jodi Geralds, MD;  Location: [facility];  Service: Orthopedics;  Laterality: Right;  . CORONARY ANGIOPLASTY WITH STENT PLACEMENT  06/29/2010   Successful percutaneous transluminal coronary angioplasty  with placement of a drug-eluting stent in the proximal right coronary  artery  . DISTAL BICEPS TENDON REPAIR Left 08/07/2019   Procedure: LEFT DISTAL BICEPS TENDON REPAIR;  Surgeon: Jodi Geralds, MD;  Location: [facility];  Service: Orthopedics;  Laterality: Left;  . EXTRACORPOREAL SHOCK WAVE LITHOTRIPSY Right 04/18/2020   Procedure: EXTRACORPOREAL SHOCK WAVE LITHOTRIPSY (ESWL);  Surgeon: Jerilee Field, MD;  Location: [facility];  Service: Urology;  Laterality: Right;  . EYE SURGERY Bilateral 2011,2013   cataract  . HEMORRHOID SURGERY     Status post hemorrhoid banding 3 or 4 times  . KNEE ARTHROSCOPY WITH EXCISION PLICA Right 03/14/2015   Procedure: EXCISION MEDIAL PLICA;  Surgeon: Jodi Geralds, MD;  Location: [facility];  Service: Orthopedics;  Laterality: Right;  . KNEE ARTHROSCOPY WITH MEDIAL MENISECTOMY Right 03/14/2015   Procedure: RIGHT KNEE ARTHROSCOPY WITH PARTIAL MEDIAL MENISECTOMY;  Surgeon: Jodi Geralds, MD;  Location: [facility];  Service: Orthopedics;  Laterality: Right;  . LEFT HEART CATH AND CORONARY ANGIOGRAPHY N/A 12/25/2016   Procedure: Left Heart Cath and Coronary Angiography;  Surgeon: Corky Crafts, MD;  Location: [facility];  Service: Cardiovascular;  Laterality: N/A;  . TRANSANAL HEMORRHOIDAL DEARTERIALIZATION N/A 05/05/2020   Procedure: TRANSANAL HEMORRHOIDAL DEARTERIALIZATION;  Surgeon: Romie Levee, MD;  Location: [facility];  Service: General;  Laterality: N/A;    Current Medications: Current Meds  Medication Sig  . Ascorbic Acid (VITAMIN C) 1000 MG tablet Take 1,000 mg by mouth daily.  Marland Kitchen aspirin 81 MG chewable tablet Chew 1 tablet (81 mg total) by mouth before cath procedure.  Marland Kitchen atorvastatin (LIPITOR) 80 MG tablet Take 1 tablet (80 mg total) by mouth daily.  . chlorthalidone (HYGROTON) 25 MG tablet Take 1 tablet by mouth daily.  . Coenzyme Q10 (CO Q 10 PO) Take 200 mg daily by mouth.   . Cyanocobalamin (VITAMIN B-12 PO) Take 1 tablet by mouth daily.  Marland Kitchen doxazosin (CARDURA) 2 MG tablet Take 2 mg by mouth every evening.   . furosemide (LASIX) 20 MG tablet Take 1 tablet by mouth daily for 3 days then only as needed  . irbesartan (AVAPRO) 300 MG tablet Take 300 mg by mouth every evening.   . metFORMIN (GLUCOPHAGE) 1000 MG tablet Take 1,000 mg by mouth 2 (two) times daily with a meal.   . Multiple Vitamins-Minerals (ZINC PO) Take 100 mg by mouth daily.  . nitroGLYCERIN (NITROSTAT) 0.4 MG SL tablet Place 1 tablet (0.4 mg total) under the tongue every 5 (five) minutes as needed for chest pain (MAX 3 TABLETS).  . polyethylene glycol (MIRALAX / GLYCOLAX) 17 g packet Take 17 g by mouth once as needed.  . potassium chloride (KLOR-CON) 10 MEQ  tablet Take 1 tablet by mouth daily when you take the Lasix     Allergies:   Patient has no known allergies.   Social History   Socioeconomic History  . Marital status: Married    Spouse name: Not on file  . Number of children: Not on file  . Years of education: Not on file  . Highest education level: Not on file  Occupational History  . Not on file  Tobacco Use  . Smoking status: Former Smoker    Packs/day: 0.50    Years: 10.00    Pack years: 5.00    Types: Cigarettes    Quit date: 08/08/2002    Years since quitting: 18.2  . Smokeless tobacco: Never Used  . Tobacco comment: quit 2004  Vaping Use  . Vaping Use: Never used  Substance and Sexual Activity  . Alcohol use: Yes  Comment: occasional  . Drug use: No  . Sexual activity: Not on file  Other Topics Concern  . Not on file  Social History Narrative  . Not on file   Social Determinants of Health   Financial Resource Strain: Not on file  Food Insecurity: Not on file  Transportation Needs: Not on file  Physical Activity: Not on file  Stress: Not on file  Social Connections: Not on file     Family History: The patient's family history includes Heart attack in his father and mother.   ROS:   Please see the history of present illness.    All other systems reviewed and are negative.   EKGs/Labs/Other Studies Reviewed:    The following studies were reviewed today:  Cath 12/2016 Left Heart Cath and Coronary Angiography    Conclusion    Mid RCA lesion, 0 %stenosed.  RPDA lesion, 25 %stenosed.  Prox Cx to Mid Cx lesion, 50 %stenosed.  Mid LAD lesion, 30 %stenosed.  Dist LAD lesion, 30 %stenosed.  The left ventricular systolic function is normal.  LV end diastolic pressure is normal.  The left ventricular ejection fraction is 55-65% by visual estimate.  There is no aortic valve stenosis.   Continue aggressive secondary prevention.  Decrease aspirin to 81 mg daily.    He will continue with  anemia w/u with Dr. Kinnie Scales.  Echo 06/2010 study Conclusions   - Left ventricle: The cavity size was normal. Wall thickness was  increased in a pattern of mild LVH. Systolic function was normal.  The estimated ejection fraction was in the range of 60% to 65%.  The study is not technically sufficient to allow evaluation of LV  diastolic function.  - Left atrium: The atrium was mildly dilated.    EKG:  EKG is ordered today.  The ekg ordered today demonstrates sinus rhythm and atrial tachycardia  Recent Labs: 05/05/2020: BUN 13; Creatinine, Ser 0.90; Hemoglobin 14.3; Potassium 3.7; Sodium 141  Recent Lipid Panel    Component Value Date/Time   CHOL 115 08/06/2019 1251   TRIG 113 08/06/2019 1251   HDL 36 (L) 08/06/2019 1251   CHOLHDL 3.2 08/06/2019 1251   CHOLHDL 4 09/08/2010 1203   VLDL 14.0 09/08/2010 1203   LDLCALC 58 08/06/2019 1251     Physical Exam:    VS:  BP 130/80 (BP Location: Left Arm, Patient Position: Sitting, Cuff Size: Normal)   Pulse 93   Ht 5\' 11"  (1.803 m)   Wt 277 lb 9.6 oz (125.9 kg)   SpO2 97%   BMI 38.72 kg/m     Wt Readings from Last 3 Encounters:  10/18/20 277 lb 9.6 oz (125.9 kg)  05/05/20 268 lb 11.2 oz (121.9 kg)  04/18/20 276 lb (125.2 kg)     GEN:  Well nourished, well developed in no acute distress HEENT: Normal NECK: No JVD; No carotid bruits LYMPHATICS: No lymphadenopathy CARDIAC: RRR, no murmurs, rubs, gallops RESPIRATORY:  Clear to auscultation without rales, wheezing or rhonchi  ABDOMEN: Soft, non-tender, non-distended MUSCULOSKELETAL: Trace lower extremity edema edema; No deformity  SKIN: Warm and dry NEUROLOGIC:  Alert and oriented x 3 PSYCHIATRIC:  Normal affect   ASSESSMENT AND PLAN:    1. Chest pain with history of CAD Patient chest pain sounds atypical.  Somewhat different than prior angina.  However given dyspnea on exertion and exercise intolerance will proceed with Myoview.  2.  Dyspnea on exertion/lower  extremity edema Could be related to excess salt intake.  I have at length discussion regarding limiting salt to less than 2 g/day.  Update echocardiogram.  Trial of Lasix for few days.  3.  Abnormal EKG Reviewed with DOD Dr. Lalla BrothersLambert.  EKG with sinus rhythm and atrial tachycardia.  Recommended to monitor symptoms with update echocardiogram and stress test.  4.  Hyperlipidemia -Most recent LDL 116.  Reviewed goal of less than 70.  He does not want to add any medications.  He will work on diet and exercise.  Continue Lipitor 80 mg daily.  5.  Hypertension -Blood pressure stable on current medication.  Shared Decision Making/Informed Consent The risks [chest pain, shortness of breath, cardiac arrhythmias, dizziness, blood pressure fluctuations, myocardial infarction, stroke/transient ischemic attack, nausea, vomiting, allergic reaction, radiation exposure, metallic taste sensation and life-threatening complications (estimated to be 1 in 10,000)], benefits (risk stratification, diagnosing coronary artery disease, treatment guidance) and alternatives of a nuclear stress test were discussed in detail with Micheal Chavez and he agrees to proceed.    Medication Adjustments/Labs and Tests Ordered: Current medicines are reviewed at length with the patient today.  Concerns regarding medicines are outlined above.  Orders Placed This Encounter  Procedures  . MYOCARDIAL PERFUSION IMAGING  . EKG 12-Lead  . ECHOCARDIOGRAM COMPLETE   Meds ordered this encounter  Medications  . furosemide (LASIX) 20 MG tablet    Sig: Take 1 tablet by mouth daily for 3 days then only as needed    Dispense:  30 tablet    Refill:  1  . potassium chloride (KLOR-CON) 10 MEQ tablet    Sig: Take 1 tablet by mouth daily when you take the Lasix    Dispense:  30 tablet    Refill:  1    Patient Instructions  Medication Instructions:  Your physician has recommended you make the following change in your medication:  1.  START  Lasix 20 mg taking 1 daily for 3 days then only as needed for weight gain of 3 lbs in 24 hours or 5 lbs in 3 days 2.  START Potassium 10 meq taking 1 tablet daily on the days you take a Lasix   *If you need a refill on your cardiac medications before your next appointment, please call your pharmacy*   Lab Work: None ordered  If you have labs (blood work) drawn today and your tests are completely normal, you will receive your results only by: Marland Kitchen. MyChart Message (if you have MyChart) OR . A paper copy in the mail If you have any lab test that is abnormal or we need to change your treatment, we will call you to review the results.   Testing/Procedures: Your physician has requested that you have an echocardiogram. Echocardiography is a painless test that uses sound waves to create images of your heart. It provides your doctor with information about the size and shape of your heart and how well your heart's chambers and valves are working. This procedure takes approximately one hour. There are no restrictions for this procedure.    Your physician has requested that you have a lexiscan myoview. For further information please visit [email or web address]. Please follow instruction sheet, BELOW:    You are scheduled for a Myocardial Perfusion Imaging Study Please arrive 15 minutes prior to your appointment time for registration and insurance purposes.  The test will take approximately 3 to 4 hours to complete; you may bring reading material.  If someone comes with you to your appointment, they will need to remain in the main  lobby due to limited space in the testing area. **If you are pregnant or breastfeeding, please notify the nuclear lab prior to your appointment**  How to prepare for your Myocardial Perfusion Test: . Do not eat or drink 3 hours prior to your test, except you may have water. . Do not consume products containing caffeine (regular or decaffeinated) 12 hours prior to your test.  (ex: coffee, chocolate, sodas, tea). . Do bring a list of your current medications with you.  If not listed below, you may take your medications as normal.  . Do wear comfortable clothes (no dresses or overalls) and walking shoes, tennis shoes preferred (No heels or open toe shoes are allowed). . Do NOT wear cologne, perfume, aftershave, or lotions (deodorant is allowed). . If these instructions are not followed, your test will have to be rescheduled.       Follow-Up: At [facility], you and your health needs are our priority.  As part of our continuing mission to provide you with exceptional heart care, we have created designated Provider Care Teams.  These Care Teams include your primary Cardiologist (physician) and Advanced Practice Providers (APPs -  Physician Assistants and Nurse Practitioners) who all work together to provide you with the care you need, when you need it.  We recommend signing up for the patient portal called "MyChart".  Sign up information is provided on this After Visit Summary.  MyChart is used to connect with patients for Virtual Visits (Telemedicine).  Patients are able to view lab/test results, encounter notes, upcoming appointments, etc.  Non-urgent messages can be sent to your provider as well.   To learn more about what you can do with MyChart, go to [email or web address].    Your next appointment:   6 Weeks   The format for your next appointment:   In Person  Provider:   You may see Charlton Haws, MD or one of the following Advanced Practice Providers on your designated Care Team:    Georgie Chard, NP    Other Instructions      Signed, Manson Passey, [place]  10/18/2020 4:06 PM    [facility]

## 2020-10-18 NOTE — Patient Instructions (Addendum)
Medication Instructions:  Your physician has recommended you make the following change in your medication:  1.  START Lasix 20 mg taking 1 daily for 3 days then only as needed for weight gain of 3 lbs in 24 hours or 5 lbs in 3 days 2.  START Potassium 10 meq taking 1 tablet daily on the days you take a Lasix   *If you need a refill on your cardiac medications before your next appointment, please call your pharmacy*   Lab Work: None ordered  If you have labs (blood work) drawn today and your tests are completely normal, you will receive your results only by: Marland Kitchen MyChart Message (if you have MyChart) OR . A paper copy in the mail If you have any lab test that is abnormal or we need to change your treatment, we will call you to review the results.   Testing/Procedures: Your physician has requested that you have an echocardiogram. Echocardiography is a painless test that uses sound waves to create images of your heart. It provides your doctor with information about the size and shape of your heart and how well your heart's chambers and valves are working. This procedure takes approximately one hour. There are no restrictions for this procedure.    Your physician has requested that you have a lexiscan myoview. For further information please visit [email or web address] Please follow instruction sheet, BELOW:    You are scheduled for a Myocardial Perfusion Imaging Study Please arrive 15 minutes prior to your appointment time for registration and insurance purposes.  The test will take approximately 3 to 4 hours to complete; you may bring reading material.  If someone comes with you to your appointment, they will need to remain in the main lobby due to limited space in the testing area. **If you are pregnant or breastfeeding, please notify the nuclear lab prior to your appointment**  How to prepare for your Myocardial Perfusion Test: . Do not eat or drink 3 hours prior to your test, except you  may have water. . Do not consume products containing caffeine (regular or decaffeinated) 12 hours prior to your test. (ex: coffee, chocolate, sodas, tea). . Do bring a list of your current medications with you.  If not listed below, you may take your medications as normal.  . Do wear comfortable clothes (no dresses or overalls) and walking shoes, tennis shoes preferred (No heels or open toe shoes are allowed). . Do NOT wear cologne, perfume, aftershave, or lotions (deodorant is allowed). . If these instructions are not followed, your test will have to be rescheduled.       Follow-Up: At [facility], you and your health needs are our priority.  As part of our continuing mission to provide you with exceptional heart care, we have created designated Provider Care Teams.  These Care Teams include your primary Cardiologist (physician) and Advanced Practice Providers (APPs -  Physician Assistants and Nurse Practitioners) who all work together to provide you with the care you need, when you need it.  We recommend signing up for the patient portal called "MyChart".  Sign up information is provided on this After Visit Summary.  MyChart is used to connect with patients for Virtual Visits (Telemedicine).  Patients are able to view lab/test results, encounter notes, upcoming appointments, etc.  Non-urgent messages can be sent to your provider as well.   To learn more about what you can do with MyChart, go to [email or web address].    Your next appointment:  6 Weeks   The format for your next appointment:   In Person  Provider:   You may see Charlton Haws, MD or one of the following Advanced Practice Providers on your designated Care Team:    Georgie Chard, NP    Other Instructions

## 2020-11-10 ENCOUNTER — Other Ambulatory Visit: Payer: Self-pay | Admitting: Physician Assistant

## 2020-11-12 ENCOUNTER — Other Ambulatory Visit: Payer: Self-pay | Admitting: Cardiovascular Disease

## 2020-11-29 ENCOUNTER — Telehealth (HOSPITAL_COMMUNITY): Payer: Self-pay | Admitting: *Deleted

## 2020-11-29 NOTE — Telephone Encounter (Signed)
Patient given detailed instructions per Myocardial Perfusion Study Information Sheet for the test on 12/05/20 at 8:15. Patient notified to arrive 15 minutes early and that it is imperative to arrive on time for appointment to keep from having the test rescheduled.  If you need to cancel or reschedule your appointment, please call the office within 24 hours of your appointment. . Patient verbalized understanding.Daneil Dolin

## 2020-12-01 ENCOUNTER — Other Ambulatory Visit: Payer: Self-pay

## 2020-12-05 ENCOUNTER — Other Ambulatory Visit: Payer: Self-pay

## 2020-12-05 ENCOUNTER — Ambulatory Visit (HOSPITAL_BASED_OUTPATIENT_CLINIC_OR_DEPARTMENT_OTHER): Payer: BC Managed Care – PPO

## 2020-12-05 ENCOUNTER — Ambulatory Visit (HOSPITAL_COMMUNITY): Payer: BC Managed Care – PPO | Attending: Cardiovascular Disease

## 2020-12-05 DIAGNOSIS — I200 Unstable angina: Secondary | ICD-10-CM | POA: Insufficient documentation

## 2020-12-05 DIAGNOSIS — R0609 Other forms of dyspnea: Secondary | ICD-10-CM

## 2020-12-05 DIAGNOSIS — R0600 Dyspnea, unspecified: Secondary | ICD-10-CM | POA: Diagnosis not present

## 2020-12-05 LAB — MYOCARDIAL PERFUSION IMAGING
LV dias vol: 129 mL (ref 62–150)
LV sys vol: 55 mL
Peak HR: 110 {beats}/min
Rest HR: 63 {beats}/min
SDS: 0
SRS: 0
SSS: 0
TID: 1.16

## 2020-12-05 LAB — ECHOCARDIOGRAM COMPLETE
Area-P 1/2: 3.84 cm2
Height: 71 in
S' Lateral: 2.6 cm
Weight: 4432 [oz_av]

## 2020-12-05 MED ORDER — TECHNETIUM TC 99M TETROFOSMIN IV KIT
10.5000 | PACK | Freq: Once | INTRAVENOUS | Status: AC | PRN
  Administered 2020-12-05: 10.5 via INTRAVENOUS
  Filled 2020-12-05: qty 11

## 2020-12-05 MED ORDER — TECHNETIUM TC 99M TETROFOSMIN IV KIT
31.1000 | PACK | Freq: Once | INTRAVENOUS | Status: AC | PRN
  Administered 2020-12-05: 31.1 via INTRAVENOUS
  Filled 2020-12-05: qty 32

## 2020-12-05 MED ORDER — REGADENOSON 0.4 MG/5ML IV SOLN
0.4000 mg | Freq: Once | INTRAVENOUS | Status: AC
  Administered 2020-12-05: 0.4 mg via INTRAVENOUS

## 2020-12-05 MED ORDER — PERFLUTREN LIPID MICROSPHERE
1.0000 mL | INTRAVENOUS | Status: AC | PRN
  Administered 2020-12-05: 3 mL via INTRAVENOUS

## 2020-12-06 ENCOUNTER — Ambulatory Visit (HOSPITAL_COMMUNITY): Payer: BC Managed Care – PPO

## 2020-12-27 NOTE — Progress Notes (Deleted)
Cardiology Office Note:    Date:  12/27/2020   ID:  Micheal Chavez, DOB 1960-09-10, MRN [id number]  PCP:  Rodrigo Ran, MD  [facility] Cardiologist:  Charlton Haws, MD  [facility] Electrophysiologist:  None   Chief Complaint: SOB  History of Present Illness:    Micheal Chavez is a 60 y.o. male with a hx of DM, HLD, HTN, CAD, OSA on CPAP    History of CAD s/p stenting to RCA January 2012.  He previously failed Plavix therapy and started on Effient.  History of angina in 2018 in setting of anemia.  Last cardiac cath July 2018 showed patent RCA stent and 50% residual circumflex disease.  Recommended medical therapy.  Seen by PA in May for dyspnea Myovue done 12/05/20 normal with EF 57% TTE done same day  EF 60-65% normal diastolic parameters mild to moderate MR   Has chronic anemia secondary to hemorrhoidal bleeding.  Occasionally requires iron infusion.  Patient was doing well when last seen February 2021. Marland Kitchen Blood work 10/17/2020 CMP: Creatinine 0.9, sodium 137, potassium 4, albumin 3.5, AST 31, ALT 57, CBC: WBC 7.21, RBC 4.4, hemoglobin 13, platelets 254 Lipid panel: Cholesterol 94, LDL of 113, HDL 23, LDL 48 PSA: Normal Vitamin D: Within normal limits Hemoglobin A1c: 7.2  ***  Past Medical History:  Diagnosis Date   Anemia    Coronary artery disease    Status post drug-eluting stent to the RCA 06/29/10. --  cath 06/29/10: LAD 30%; Cfx 30-50%; EF 60-65%   DM type 2 (diabetes mellitus, type 2) (HCC)    Dyslipidemia    Glucose intolerance (impaired glucose tolerance)    Hemorrhoids    History of kidney stones    Hyperlipidemia    Hypertension    Obesity    Previous back surgery    Rupture of left distal biceps tendon    Sleep apnea    Uses CPAP nightly    Past Surgical History:  Procedure Laterality Date   BACK SURGERY  , Vanessa Kick [id number] last done    ruptured disc lower back   CARDIAC CATHETERIZATION  06/29/2010   LAD 30%; Cfx 30-50%; EF 60-65%    CHONDROPLASTY  Right 03/14/2015   Procedure: PATELLO-FEMORAL, MEDIAL FEMORAL CONDYLE CHONDROPLASTY WITH MICROFRACTURE TECHNIQUE;  Surgeon: Jodi Geralds, MD;  Location: [facility];  Service: Orthopedics;  Laterality: Right;   CORONARY ANGIOPLASTY WITH STENT PLACEMENT  06/29/2010   Successful percutaneous transluminal coronary angioplasty  with placement of a drug-eluting stent in the proximal right coronary  artery   DISTAL BICEPS TENDON REPAIR Left 08/07/2019   Procedure: LEFT DISTAL BICEPS TENDON REPAIR;  Surgeon: Jodi Geralds, MD;  Location: [facility];  Service: Orthopedics;  Laterality: Left;   EXTRACORPOREAL SHOCK WAVE LITHOTRIPSY Right 04/18/2020   Procedure: EXTRACORPOREAL SHOCK WAVE LITHOTRIPSY (ESWL);  Surgeon: Jerilee Field, MD;  Location: [facility];  Service: Urology;  Laterality: Right;   EYE SURGERY Bilateral 2011,2013   cataract   HEMORRHOID SURGERY     Status post hemorrhoid banding 3 or 4 times   KNEE ARTHROSCOPY WITH EXCISION PLICA Right 03/14/2015   Procedure: EXCISION MEDIAL PLICA;  Surgeon: Jodi Geralds, MD;  Location: [facility];  Service: Orthopedics;  Laterality: Right;   KNEE ARTHROSCOPY WITH MEDIAL MENISECTOMY Right 03/14/2015   Procedure: RIGHT KNEE ARTHROSCOPY WITH PARTIAL MEDIAL MENISECTOMY;  Surgeon: Jodi Geralds, MD;  Location: [facility];  Service: Orthopedics;  Laterality: Right;  LEFT HEART CATH AND CORONARY ANGIOGRAPHY N/A 12/25/2016   Procedure: Left Heart Cath and Coronary Angiography;  Surgeon: Corky Crafts, MD;  Location: [facility] INVASIVE CV LAB;  Service: Cardiovascular;  Laterality: N/A;   TRANSANAL HEMORRHOIDAL DEARTERIALIZATION N/A 05/05/2020   Procedure: TRANSANAL HEMORRHOIDAL DEARTERIALIZATION;  Surgeon: Romie Levee, MD;  Location: [facility];  Service: General;  Laterality: N/A;    Current Medications: No outpatient medications have been marked as taking for the 01/04/21  encounter (Appointment) with Wendall Stade, MD.     Allergies:   Patient has no known allergies.   Social History   Socioeconomic History   Marital status: Married    Spouse name: Not on file   Number of children: Not on file   Years of education: Not on file   Highest education level: Not on file  Occupational History   Not on file  Tobacco Use   Smoking status: Former    Packs/day: 0.50    Years: 10.00    Pack years: 5.00    Types: Cigarettes    Quit date: 08/08/2002    Years since quitting: 18.4   Smokeless tobacco: Never   Tobacco comments:    quit 2004  Vaping Use   Vaping Use: Never used  Substance and Sexual Activity   Alcohol use: Yes    Comment: occasional   Drug use: No   Sexual activity: Not on file  Other Topics Concern   Not on file  Social History Narrative   Not on file   Social Determinants of Health   Financial Resource Strain: Not on file  Food Insecurity: Not on file  Transportation Needs: Not on file  Physical Activity: Not on file  Stress: Not on file  Social Connections: Not on file     Family History: The patient's family history includes Heart attack in his father and mother.   ROS:   Please see the history of present illness.    All other systems reviewed and are negative.   EKGs/Labs/Other Studies Reviewed:    The following studies were reviewed today:  Cath 12/2016 Left Heart Cath and Coronary Angiography    Conclusion    Mid RCA lesion, 0 %stenosed. RPDA lesion, 25 %stenosed. Prox Cx to Mid Cx lesion, 50 %stenosed. Mid LAD lesion, 30 %stenosed. Dist LAD lesion, 30 %stenosed. The left ventricular systolic function is normal. LV end diastolic pressure is normal. The left ventricular ejection fraction is 55-65% by visual estimate. There is no aortic valve stenosis.   Continue aggressive secondary prevention.  Decrease aspirin to 81 mg daily.     He will continue with anemia w/u with Dr. Kinnie Scales.  Echo 12/05/20    IMPRESSIONS     1. Left ventricular ejection fraction, by estimation, is 60 to 65%. The  left ventricle has normal function. The left ventricle has no regional  wall motion abnormalities. There is moderate asymmetric left ventricular  hypertrophy. Left ventricular  diastolic parameters were normal.   2. Right ventricular systolic function is normal. The right ventricular  size is normal.   3. Right atrial size was mildly dilated.   4. The mitral valve is normal in structure. Mild to moderate mitral valve  regurgitation.   5. The aortic valve is normal in structure. Aortic valve regurgitation is  not visualized. No aortic stenosis is present.   Myovue 12/05/20  Study Highlights  The left ventricular ejection fraction is normal (55-65%). There was no ST segment deviation noted  during stress. The study is normal. This is a low risk study. Nuclear stress EF: 57%.    EKG:  EKG is ordered today.  The ekg ordered today demonstrates sinus rhythm and atrial tachycardia  Recent Labs: 05/05/2020: BUN 13; Creatinine, Ser 0.90; Hemoglobin 14.3; Potassium 3.7; Sodium 141  Recent Lipid Panel    Component Value Date/Time   CHOL 115 08/06/2019 1251   TRIG 113 08/06/2019 1251   HDL 36 (L) 08/06/2019 1251   CHOLHDL 3.2 08/06/2019 1251   CHOLHDL 4 09/08/2010 1203   VLDL 14.0 09/08/2010 1203   LDLCALC 58 08/06/2019 1251     Physical Exam:    VS:  There were no vitals taken for this visit.    Wt Readings from Last 3 Encounters:  12/05/20 125.6 kg  10/18/20 125.9 kg  05/05/20 121.9 kg     Affect appropriate Healthy:  appears stated age HEENT: normal Neck supple with no adenopathy JVP normal no bruits no thyromegaly Lungs clear with no wheezing and good diaphragmatic motion Heart:  S1/S2 no murmur, no rub, gallop or click PMI normal Abdomen: benighn, BS positve, no tenderness, no AAA no bruit.  No HSM or HJR Distal pulses intact with no bruits No edema Neuro  non-focal Skin warm and dry No muscular weakness   ASSESSMENT AND PLAN:    CAD:  stent to RCA 2012 patent cath 2018 non ischemic myovue 12/05/20 stable continue medical Rx  2.  Dyspnea :  Echo and myovue low risk normal EF Hct stable Needs f/u CXR   3.  Abnormal EKG. Short burst atrial tachycardia on ECG 10/18/20 observe   4.  Hyperlipidemia:  continue high dose lipitor   5.  Hypertension:  Well controlled.  Continue current medications and low sodium Dash type diet.     Medication Adjustments/Labs and Tests Ordered: Current medicines are reviewed at length with the patient today.  Concerns regarding medicines are outlined above.  No orders of the defined types were placed in this encounter.  No orders of the defined types were placed in this encounter.   There are no Patient Instructions on file for this visit.   F/U in a year  CXR for dyspnea   Signed, Charlton Haws, MD  12/27/2020 12:48 PM    [facility]

## 2021-01-04 ENCOUNTER — Ambulatory Visit: Payer: BC Managed Care – PPO | Admitting: Cardiovascular Disease

## 2021-02-03 NOTE — Progress Notes (Signed)
Cardiology Office Note:    Virtual Visit via Video Note   This visit type was conducted due to national recommendations for restrictions regarding the COVID-19 Pandemic (e.g. social distancing) in an effort to limit this patient's exposure and mitigate transmission in our community.  Due to her co-morbid illnesses, this patient is at least at moderate risk for complications without adequate follow up.  This format is felt to be most appropriate for this patient at this time.  All issues noted in this document were discussed and addressed.  A limited physical exam was performed with this format.  Please refer to the patient's chart for her consent to telehealth [facility].   Patient Location: Home Physician Location : Office   Date:  02/06/2021   ID:  Micheal FlavinSamuel M Lehane, DOB 18-Dec-1960, MRN [id number]  PCP:  Rodrigo RanPerini, Mark, MD  [facility] Cardiologist:  Charlton HawsPeter Jessen Siegman, MD  [facility] Electrophysiologist:  None   Chief Complaint: SOB  History of Present Illness:    Micheal Chavez is a 60 y.o. male with a hx of DM, HLD, HTN, CAD, OSA on CPAP    History of CAD s/p stenting to RCA January 2012.  He previously failed Plavix therapy and started on Effient.  History of angina in 2018 in setting of anemia.  Last cardiac cath July 2018 showed patent RCA stent and 50% residual circumflex disease.  Recommended medical therapy.  Seen by PA in May for dyspnea Myovue done 12/05/20 normal with EF 57% TTE done same day  EF 60-65% normal diastolic parameters mild to moderate MR   Has chronic anemia secondary to hemorrhoidal bleeding.  Surgery for this last fall  Occasionally requires iron infusion.  . Blood work 10/17/2020 CMP: Creatinine 0.9, sodium 137, potassium 4, albumin 3.5, AST 31, ALT 57, CBC: WBC 7.21, RBC 4.4, hemoglobin 13, platelets 254 Lipid panel: Cholesterol 94, LDL of 113, HDL 23, LDL 48 PSA: Normal Vitamin D: Within normal limits Hemoglobin A1c: 7.2  Virtual visit today due  to sinus infection On Z pack has f/u with Perrini  Expecting two grand babies soon   Past Medical History:  Diagnosis Date   Anemia    Coronary artery disease    Status post drug-eluting stent to the RCA 06/29/10. --  cath 06/29/10: LAD 30%; Cfx 30-50%; EF 60-65%   DM type 2 (diabetes mellitus, type 2) (HCC)    Dyslipidemia    Glucose intolerance (impaired glucose tolerance)    Hemorrhoids    History of kidney stones    Hyperlipidemia    Hypertension    Obesity    Previous back surgery    Rupture of left distal biceps tendon    Sleep apnea    Uses CPAP nightly    Past Surgical History:  Procedure Laterality Date   BACK SURGERY  , Vanessa Kickjan [id number] last done    ruptured disc lower back   CARDIAC CATHETERIZATION  06/29/2010   LAD 30%; Cfx 30-50%; EF 60-65%    CHONDROPLASTY Right 03/14/2015   Procedure: PATELLO-FEMORAL, MEDIAL FEMORAL CONDYLE CHONDROPLASTY WITH MICROFRACTURE TECHNIQUE;  Surgeon: Jodi GeraldsJohn Graves, MD;  Location: [facility];  Service: Orthopedics;  Laterality: Right;   CORONARY ANGIOPLASTY WITH STENT PLACEMENT  06/29/2010   Successful percutaneous transluminal coronary angioplasty  with placement of a drug-eluting stent in the proximal right coronary  artery   DISTAL BICEPS TENDON REPAIR Left 08/07/2019   Procedure: LEFT DISTAL BICEPS TENDON REPAIR;  Surgeon: Jodi GeraldsGraves, John, MD;  Location:  [facility];  Service: Orthopedics;  Laterality: Left;   EXTRACORPOREAL SHOCK WAVE LITHOTRIPSY Right 04/18/2020   Procedure: EXTRACORPOREAL SHOCK WAVE LITHOTRIPSY (ESWL);  Surgeon: Jerilee Field, MD;  Location: [facility];  Service: Urology;  Laterality: Right;   EYE SURGERY Bilateral 2011,2013   cataract   HEMORRHOID SURGERY     Status post hemorrhoid banding 3 or 4 times   KNEE ARTHROSCOPY WITH EXCISION PLICA Right 03/14/2015   Procedure: EXCISION MEDIAL PLICA;  Surgeon: Jodi Geralds, MD;  Location: [facility];  Service:  Orthopedics;  Laterality: Right;   KNEE ARTHROSCOPY WITH MEDIAL MENISECTOMY Right 03/14/2015   Procedure: RIGHT KNEE ARTHROSCOPY WITH PARTIAL MEDIAL MENISECTOMY;  Surgeon: Jodi Geralds, MD;  Location: [facility];  Service: Orthopedics;  Laterality: Right;   LEFT HEART CATH AND CORONARY ANGIOGRAPHY N/A 12/25/2016   Procedure: Left Heart Cath and Coronary Angiography;  Surgeon: Corky Crafts, MD;  Location: [facility] INVASIVE CV LAB;  Service: Cardiovascular;  Laterality: N/A;   TRANSANAL HEMORRHOIDAL DEARTERIALIZATION N/A 05/05/2020   Procedure: TRANSANAL HEMORRHOIDAL DEARTERIALIZATION;  Surgeon: Romie Levee, MD;  Location: [facility];  Service: General;  Laterality: N/A;    Current Medications: Current Meds  Medication Sig   Ascorbic Acid (VITAMIN C) 1000 MG tablet Take 1,000 mg by mouth daily.   aspirin 81 MG chewable tablet Chew 1 tablet (81 mg total) by mouth before cath procedure.   atorvastatin (LIPITOR) 80 MG tablet Take 1 tablet (80 mg total) by mouth daily.   azithromycin (ZITHROMAX) 250 MG tablet Take 250 mg by mouth as directed.   chlorthalidone (HYGROTON) 25 MG tablet Take 1 tablet by mouth daily.   Coenzyme Q10 (CO Q 10 PO) Take 200 mg daily by mouth.    Cyanocobalamin (VITAMIN B-12 PO) Take 1 tablet by mouth daily.   doxazosin (CARDURA) 2 MG tablet Take 2 mg by mouth every evening.    irbesartan (AVAPRO) 300 MG tablet Take 300 mg by mouth every evening.    metFORMIN (GLUCOPHAGE) 1000 MG tablet Take 1,000 mg by mouth 2 (two) times daily with a meal.    Multiple Vitamins-Minerals (ZINC PO) Take 100 mg by mouth daily.     Allergies:   Patient has no known allergies.   Social History   Socioeconomic History   Marital status: Married    Spouse name: Not on file   Number of children: Not on file   Years of education: Not on file   Highest education level: Not on file  Occupational History   Not on file  Tobacco Use   Smoking status: Former     Packs/day: 0.50    Years: 10.00    Pack years: 5.00    Types: Cigarettes    Quit date: 08/08/2002    Years since quitting: 18.5   Smokeless tobacco: Never   Tobacco comments:    quit 2004  Vaping Use   Vaping Use: Never used  Substance and Sexual Activity   Alcohol use: Yes    Comment: occasional   Drug use: No   Sexual activity: Not on file  Other Topics Concern   Not on file  Social History Narrative   Not on file   Social Determinants of Health   Financial Resource Strain: Not on file  Food Insecurity: Not on file  Transportation Needs: Not on file  Physical Activity: Not on file  Stress: Not on file  Social Connections: Not on file  Family History: The patient's family history includes Heart attack in his father and mother.   ROS:   Please see the history of present illness.    All other systems reviewed and are negative.   EKGs/Labs/Other Studies Reviewed:    The following studies were reviewed today:  Cath 12/2016 Left Heart Cath and Coronary Angiography    Conclusion    Mid RCA lesion, 0 %stenosed. RPDA lesion, 25 %stenosed. Prox Cx to Mid Cx lesion, 50 %stenosed. Mid LAD lesion, 30 %stenosed. Dist LAD lesion, 30 %stenosed. The left ventricular systolic function is normal. LV end diastolic pressure is normal. The left ventricular ejection fraction is 55-65% by visual estimate. There is no aortic valve stenosis.   Continue aggressive secondary prevention.  Decrease aspirin to 81 mg daily.     He will continue with anemia w/u with Dr. Kinnie Scales.  Echo 12/05/20   IMPRESSIONS     1. Left ventricular ejection fraction, by estimation, is 60 to 65%. The  left ventricle has normal function. The left ventricle has no regional  wall motion abnormalities. There is moderate asymmetric left ventricular  hypertrophy. Left ventricular  diastolic parameters were normal.   2. Right ventricular systolic function is normal. The right ventricular  size is  normal.   3. Right atrial size was mildly dilated.   4. The mitral valve is normal in structure. Mild to moderate mitral valve  regurgitation.   5. The aortic valve is normal in structure. Aortic valve regurgitation is  not visualized. No aortic stenosis is present.   Myovue 12/05/20  Study Highlights  The left ventricular ejection fraction is normal (55-65%). There was no ST segment deviation noted during stress. The study is normal. This is a low risk study. Nuclear stress EF: 57%.    EKG:  EKG is ordered today.  The ekg ordered today demonstrates sinus rhythm and atrial tachycardia  Recent Labs: 05/05/2020: BUN 13; Creatinine, Ser 0.90; Hemoglobin 14.3; Potassium 3.7; Sodium 141  Recent Lipid Panel    Component Value Date/Time   CHOL 115 08/06/2019 1251   TRIG 113 08/06/2019 1251   HDL 36 (L) 08/06/2019 1251   CHOLHDL 3.2 08/06/2019 1251   CHOLHDL 4 09/08/2010 1203   VLDL 14.0 09/08/2010 1203   LDLCALC 58 08/06/2019 1251     Physical Exam:    VS:  BP (!) 146/75   Pulse 66   Ht 5\' 11"  (1.803 m)   Wt 117.9 kg   BMI 36.26 kg/m     Wt Readings from Last 3 Encounters:  02/06/21 117.9 kg  12/05/20 125.6 kg  10/18/20 125.9 kg     Overweight white male Coughing No distress No JVP elevation  No tachypnea    ASSESSMENT AND PLAN:    CAD:  stent to RCA 2012 patent cath 2018 non ischemic myovue 12/05/20 stable continue medical Rx  2.  Dyspnea :  Echo and myovue low risk normal EF Hct stable Needs f/u CXR   3.  Abnormal EKG. Short burst atrial tachycardia on ECG 10/18/20 observe   4.  Hyperlipidemia:  continue high dose lipitor   5.  Hypertension:  Well controlled.  Continue current medications and low sodium Dash type diet.    6. DM:  A1c > 7   Medication Adjustments/Labs and Tests Ordered: Current medicines are reviewed at length with the patient today.  Concerns regarding medicines are outlined above.  No orders of the defined types were placed in this  encounter.  No  orders of the defined types were placed in this encounter.   F/U in 6 months   Time: spent reviewing chart recent myovue, echo direct patient interview and composing note 22 minutes   Signed, Charlton Haws, MD  02/06/2021 8:34 AM     [facility]

## 2021-02-06 ENCOUNTER — Other Ambulatory Visit: Payer: Self-pay

## 2021-02-06 ENCOUNTER — Telehealth (INDEPENDENT_AMBULATORY_CARE_PROVIDER_SITE_OTHER): Payer: BC Managed Care – PPO | Admitting: Cardiovascular Disease

## 2021-02-06 VITALS — BP 146/75 | HR 66 | Ht 71.0 in | Wt 260.0 lb

## 2021-02-06 DIAGNOSIS — E782 Mixed hyperlipidemia: Secondary | ICD-10-CM

## 2021-02-06 DIAGNOSIS — R0600 Dyspnea, unspecified: Secondary | ICD-10-CM

## 2021-02-06 DIAGNOSIS — I10 Essential (primary) hypertension: Secondary | ICD-10-CM | POA: Diagnosis not present

## 2021-02-06 DIAGNOSIS — I2510 Atherosclerotic heart disease of native coronary artery without angina pectoris: Secondary | ICD-10-CM | POA: Diagnosis not present

## 2021-02-06 DIAGNOSIS — R0609 Other forms of dyspnea: Secondary | ICD-10-CM

## 2021-02-06 NOTE — Patient Instructions (Signed)
Medication Instructions:  *If you need a refill on your cardiac medications before your next appointment, please call your pharmacy*   Lab Work: If you have labs (blood work) drawn today and your tests are completely normal, you will receive your results only by: MyChart Message (if you have MyChart) OR A paper copy in the mail If you have any lab test that is abnormal or we need to change your treatment, we will call you to review the results.  Follow-Up: At [facility], you and your health needs are our priority.  As part of our continuing mission to provide you with exceptional heart care, we have created designated Provider Care Teams.  These Care Teams include your primary Cardiologist (physician) and Advanced Practice Providers (APPs -  Physician Assistants and Nurse Practitioners) who all work together to provide you with the care you need, when you need it.  We recommend signing up for the patient portal called "MyChart".  Sign up information is provided on this After Visit Summary.  MyChart is used to connect with patients for Virtual Visits (Telemedicine).  Patients are able to view lab/test results, encounter notes, upcoming appointments, etc.  Non-urgent messages can be sent to your provider as well.   To learn more about what you can do with MyChart, go to [email or web address].    Your next appointment:   6 month(s)  The format for your next appointment:   In Person  Provider:   You may see Peter Nishan, MD or one of the following Advanced Practice Providers on your designated Care Team:   Laura Ingold, NP  

## 2021-02-07 DIAGNOSIS — J069 Acute upper respiratory infection, unspecified: Secondary | ICD-10-CM | POA: Diagnosis not present

## 2021-03-06 ENCOUNTER — Telehealth: Payer: Self-pay | Admitting: Gastroenterology

## 2021-03-06 NOTE — Telephone Encounter (Signed)
Hi Dr. Adela Lank, we have received a referral from patient's PCP for a repeat colon. Patient had colon in 2018 with Dr. Kinnie Scales but would like to transfer his care to you because Dr. Kinnie Scales is transitioning to retirement. Records have been received and they will be sent to you for review. Please advise on scheduling. Thank you.

## 2021-03-07 NOTE — Telephone Encounter (Signed)
Pt informed

## 2021-03-07 NOTE — Telephone Encounter (Signed)
Left message to call back.  Recall entered.

## 2021-03-07 NOTE — Telephone Encounter (Signed)
Chart reviewed.  Dr. Kinnie Scales performed a colonoscopy 02/04/2017 - family history of colon cancer reported. Good prep. No polyps, diverticulosis noted. He recommended a repeat exam in 5 years. Based on this, he is not due for another colonoscopy until 01/2022, if you can place a recall. If he is having symptoms that he would like to be seen sooner for, than I am happy to see him in the clinic in the interim. Thanks

## 2021-03-30 DIAGNOSIS — D485 Neoplasm of uncertain behavior of skin: Secondary | ICD-10-CM | POA: Diagnosis not present

## 2021-03-30 DIAGNOSIS — D225 Melanocytic nevi of trunk: Secondary | ICD-10-CM | POA: Diagnosis not present

## 2021-03-30 DIAGNOSIS — L570 Actinic keratosis: Secondary | ICD-10-CM | POA: Diagnosis not present

## 2021-03-30 DIAGNOSIS — L918 Other hypertrophic disorders of the skin: Secondary | ICD-10-CM | POA: Diagnosis not present

## 2021-03-30 DIAGNOSIS — L821 Other seborrheic keratosis: Secondary | ICD-10-CM | POA: Diagnosis not present

## 2021-03-30 DIAGNOSIS — Z7189 Other specified counseling: Secondary | ICD-10-CM | POA: Diagnosis not present

## 2021-03-30 DIAGNOSIS — C44519 Basal cell carcinoma of skin of other part of trunk: Secondary | ICD-10-CM | POA: Diagnosis not present

## 2021-04-27 DIAGNOSIS — L918 Other hypertrophic disorders of the skin: Secondary | ICD-10-CM | POA: Diagnosis not present

## 2021-04-27 DIAGNOSIS — C44519 Basal cell carcinoma of skin of other part of trunk: Secondary | ICD-10-CM | POA: Diagnosis not present

## 2021-05-01 DIAGNOSIS — I10 Essential (primary) hypertension: Secondary | ICD-10-CM | POA: Diagnosis not present

## 2021-05-05 DIAGNOSIS — R945 Abnormal results of liver function studies: Secondary | ICD-10-CM | POA: Diagnosis not present

## 2021-05-05 DIAGNOSIS — E1169 Type 2 diabetes mellitus with other specified complication: Secondary | ICD-10-CM | POA: Diagnosis not present

## 2021-05-12 ENCOUNTER — Other Ambulatory Visit: Payer: Self-pay | Admitting: Internal Medicine

## 2021-05-12 DIAGNOSIS — R7989 Other specified abnormal findings of blood chemistry: Secondary | ICD-10-CM

## 2021-05-17 ENCOUNTER — Ambulatory Visit
Admission: RE | Admit: 2021-05-17 | Discharge: 2021-05-17 | Disposition: A | Discharge status: Home / Self Care | Payer: BC Managed Care – PPO | Source: Ambulatory Visit | Attending: Internal Medicine | Admitting: Internal Medicine

## 2021-05-17 DIAGNOSIS — R945 Abnormal results of liver function studies: Secondary | ICD-10-CM | POA: Diagnosis not present

## 2021-05-17 DIAGNOSIS — K760 Fatty (change of) liver, not elsewhere classified: Secondary | ICD-10-CM | POA: Diagnosis not present

## 2021-05-17 DIAGNOSIS — R7989 Other specified abnormal findings of blood chemistry: Secondary | ICD-10-CM

## 2021-05-17 DIAGNOSIS — K8020 Calculus of gallbladder without cholecystitis without obstruction: Secondary | ICD-10-CM | POA: Diagnosis not present

## 2021-11-06 DIAGNOSIS — E291 Testicular hypofunction: Secondary | ICD-10-CM | POA: Diagnosis not present

## 2021-11-06 DIAGNOSIS — Z1331 Encounter for screening for depression: Secondary | ICD-10-CM | POA: Diagnosis not present

## 2021-11-06 DIAGNOSIS — Z125 Encounter for screening for malignant neoplasm of prostate: Secondary | ICD-10-CM | POA: Diagnosis not present

## 2021-11-06 DIAGNOSIS — E785 Hyperlipidemia, unspecified: Secondary | ICD-10-CM | POA: Diagnosis not present

## 2021-11-06 DIAGNOSIS — E1169 Type 2 diabetes mellitus with other specified complication: Secondary | ICD-10-CM | POA: Diagnosis not present

## 2021-11-06 DIAGNOSIS — R82998 Other abnormal findings in urine: Secondary | ICD-10-CM | POA: Diagnosis not present

## 2021-11-06 DIAGNOSIS — Z0000 Encounter for general adult medical examination without abnormal findings: Secondary | ICD-10-CM | POA: Diagnosis not present

## 2021-11-06 DIAGNOSIS — Z1389 Encounter for screening for other disorder: Secondary | ICD-10-CM | POA: Diagnosis not present

## 2021-11-06 DIAGNOSIS — I10 Essential (primary) hypertension: Secondary | ICD-10-CM | POA: Diagnosis not present

## 2022-01-10 DIAGNOSIS — Z08 Encounter for follow-up examination after completed treatment for malignant neoplasm: Secondary | ICD-10-CM | POA: Diagnosis not present

## 2022-01-10 DIAGNOSIS — L821 Other seborrheic keratosis: Secondary | ICD-10-CM | POA: Diagnosis not present

## 2022-01-10 DIAGNOSIS — L570 Actinic keratosis: Secondary | ICD-10-CM | POA: Diagnosis not present

## 2022-01-10 DIAGNOSIS — L814 Other melanin hyperpigmentation: Secondary | ICD-10-CM | POA: Diagnosis not present

## 2022-01-10 DIAGNOSIS — Z85828 Personal history of other malignant neoplasm of skin: Secondary | ICD-10-CM | POA: Diagnosis not present

## 2022-02-12 NOTE — Telephone Encounter (Signed)
PT was reinformed of transfer of care approval. He is not having any symptoms so he will not be scheduling at the time.

## 2022-03-26 ENCOUNTER — Encounter: Payer: Self-pay | Admitting: Gastroenterology

## 2022-05-24 IMAGING — CT CT ABD-PELV W/O CM
2 of 4 series · 13 of 46 positions shown, 15 images · non-contrast
Comparison: 01/31/2011

CLINICAL DATA: Gross hematuria. Right flank pain. History of kidney
stones.

EXAM:
CT ABDOMEN AND PELVIS WITHOUT CONTRAST
TECHNIQUE: Multidetector CT imaging of the abdomen and pelvis was performed
following the standard protocol without IV contrast.

[Series 2: routine abdomen pelvis without 5.00 br40 s3 axial · axial · non-contrast · 0.73mm/px · z∈[+1067,+1477]mm · 10 of 100 slices shown, 12 images]
[im 9/100  soft-tissue]
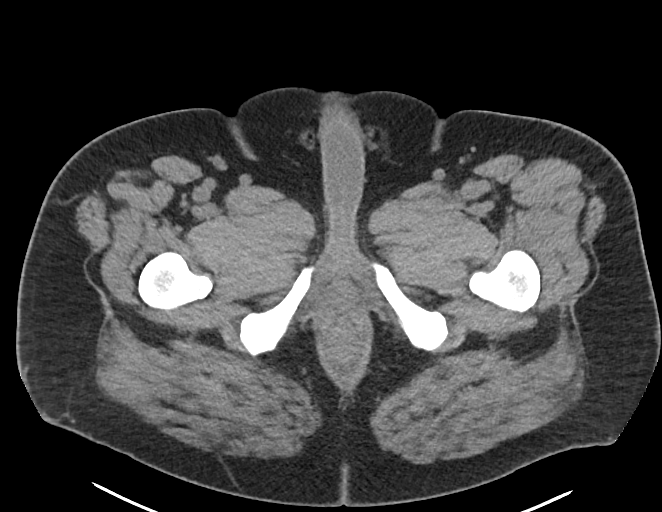
[im 9/100  bone]
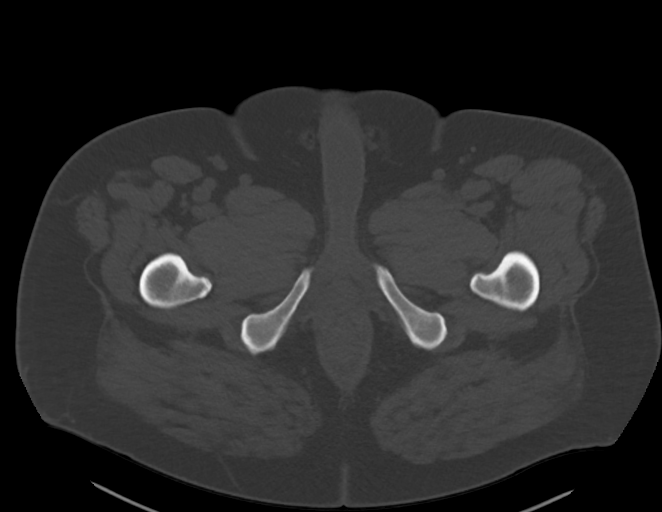
[im 17/100  soft-tissue]
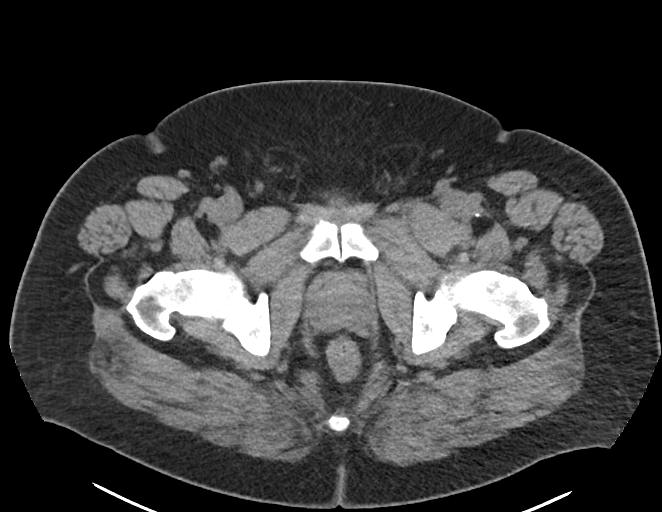
[im 25/100  soft-tissue]
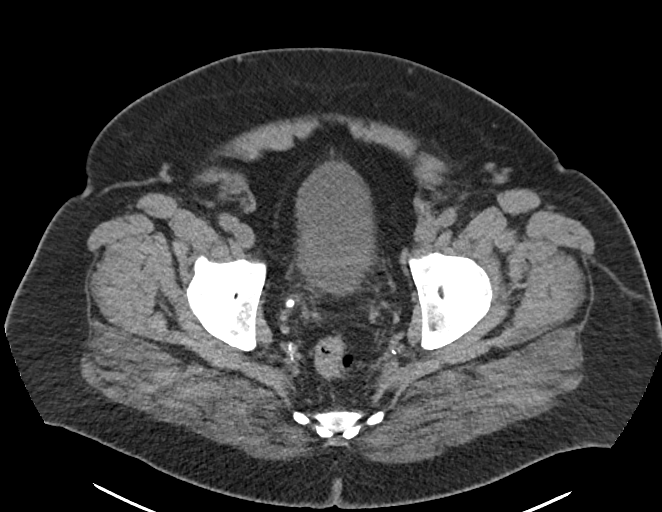
[im 38/100  soft-tissue]
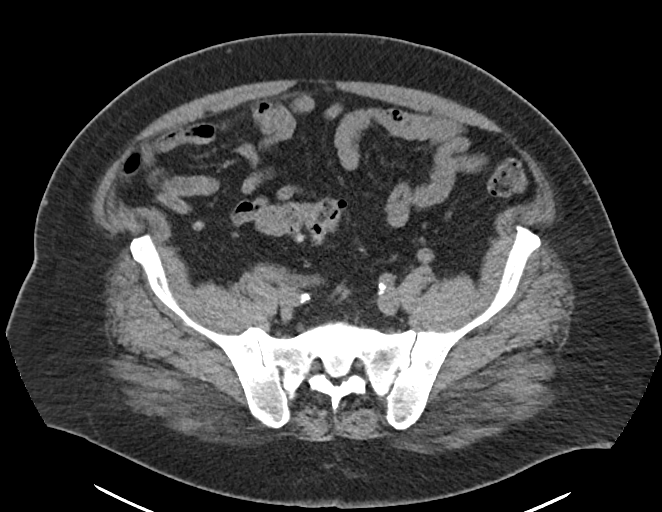
[im 46/100  soft-tissue]
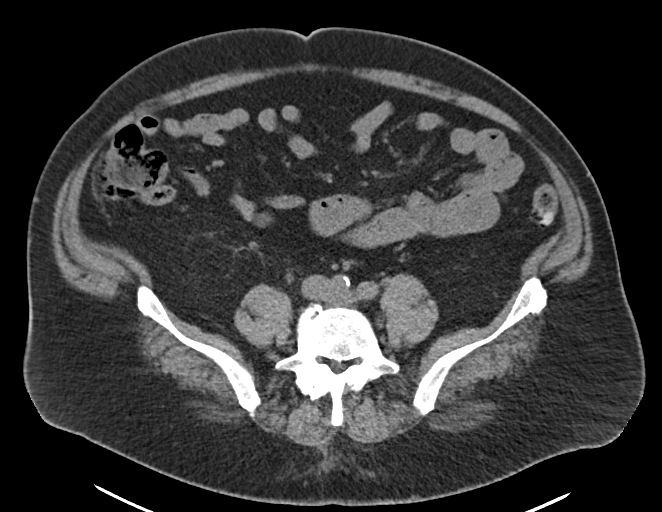
[im 54/100  soft-tissue]
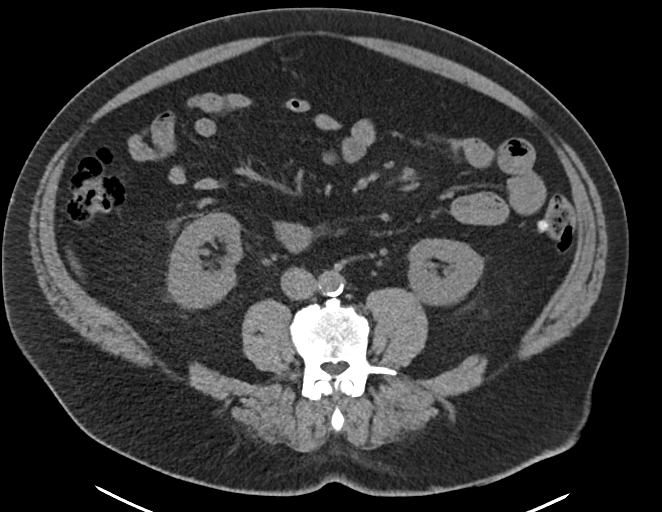
[im 62/100  soft-tissue]
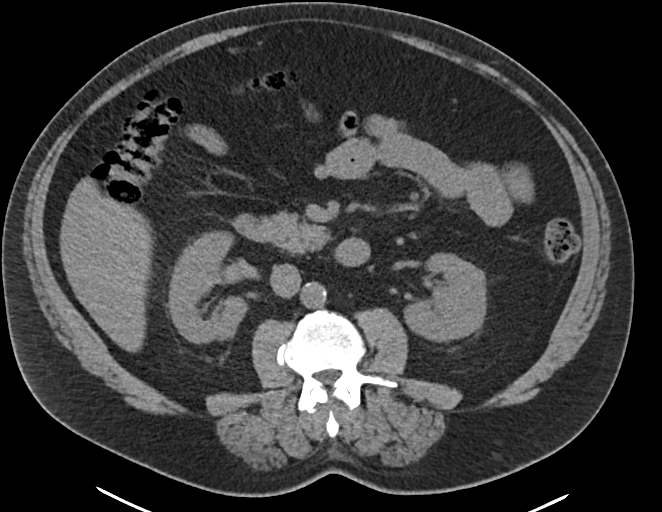
[im 75/100  soft-tissue]
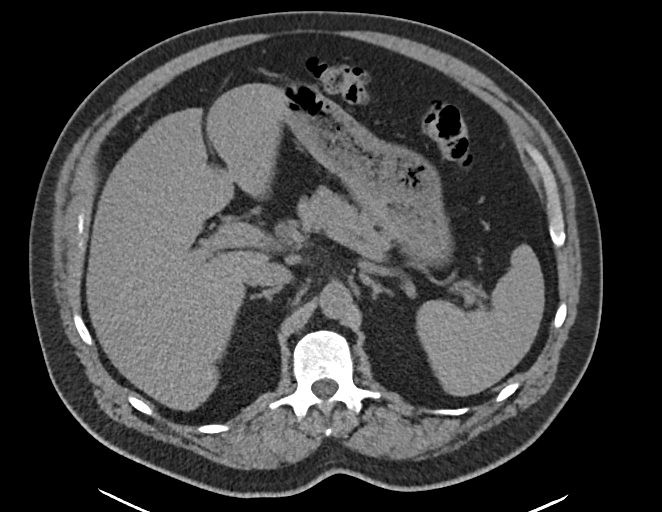
[im 83/100  soft-tissue]
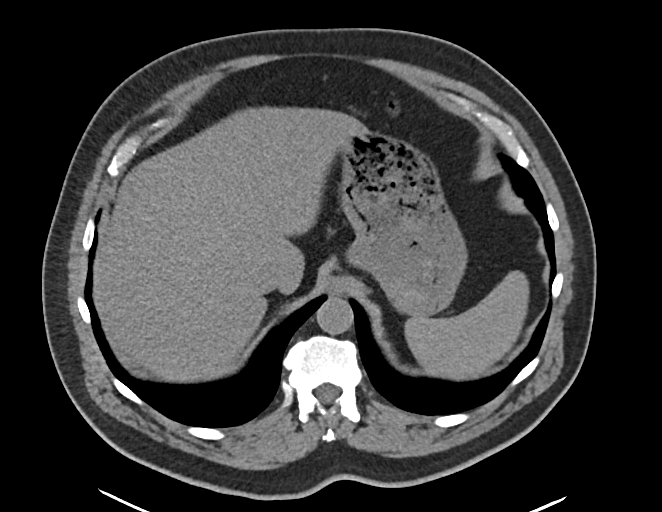
[im 83/100  bone]
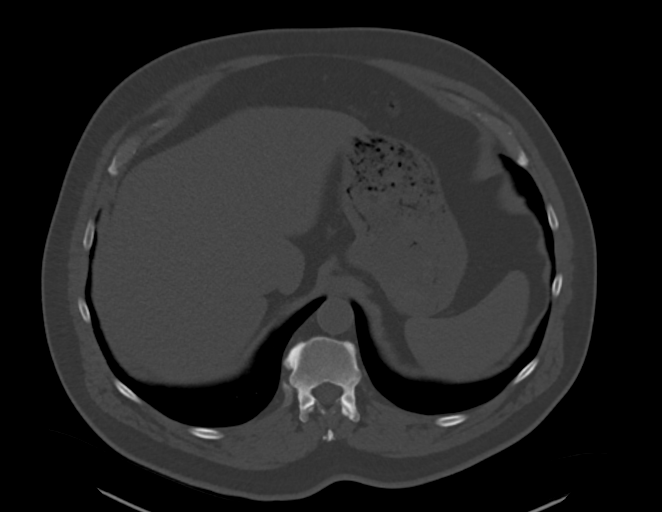
[im 91/100  soft-tissue]
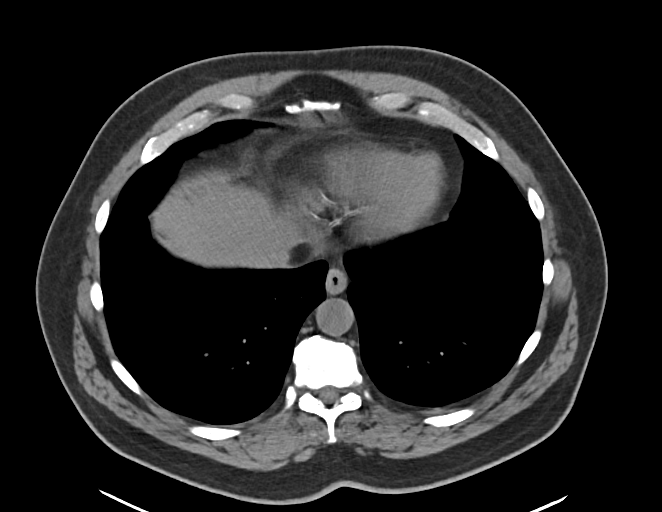

[Series 4: routine abdomen pelvis without 2.00 br40 s3 cor · coronal · non-contrast · 0.94mm/px · 3 of 186 slices shown]
[im 62/186  soft-tissue]
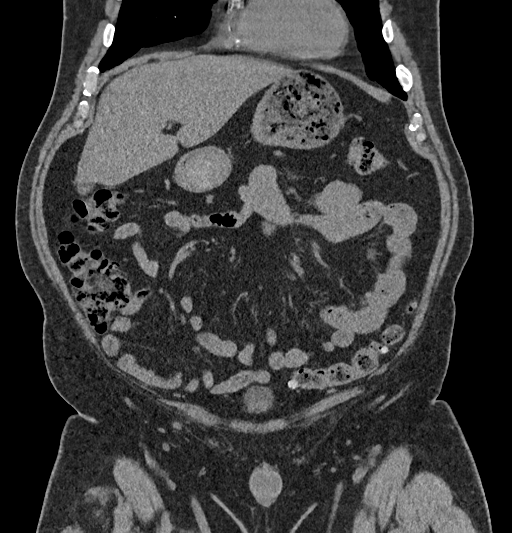
[im 83/186  soft-tissue]
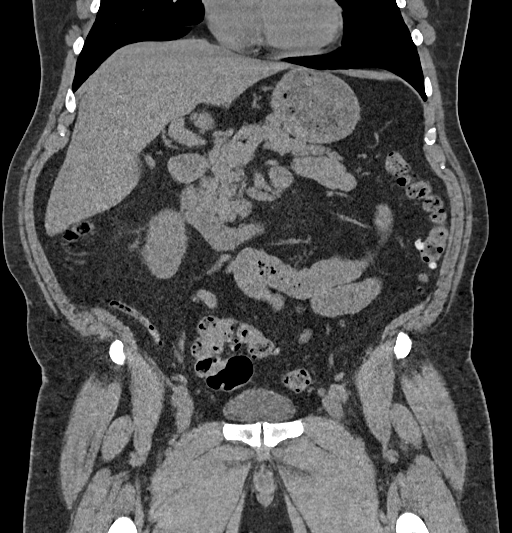
[im 103/186  soft-tissue]
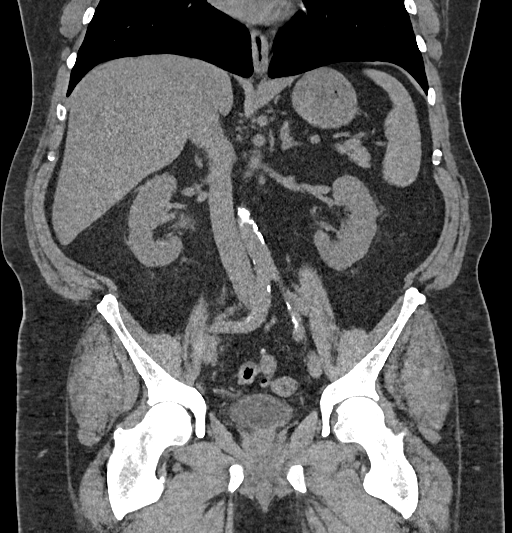

[13 of 46 positions shown; findings below may reference images not displayed]

FINDINGS: Lower chest: No acute abnormality. Coronary artery atherosclerotic
calcifications.

Hepatobiliary: No focal liver abnormality. Multiple stones
identified within the gallbladder measuring up to 7 mm. No signs of
gallbladder wall inflammation or bile duct dilatation.

Pancreas: Unremarkable. No pancreatic ductal dilatation or
surrounding inflammatory changes.

Spleen: Normal in size without focal abnormality.

Adrenals/Urinary Tract: Normal appearance of the adrenal glands. No
kidney stones or mass identified bilaterally. No significant
hydronephrosis. Right pelviectasis and mild right hydroureter
identified. Within the distal right ureter there is a stone which
measures 7 mm in diameter, image 75/2. Urinary bladder unremarkable.

Stomach/Bowel: The stomach is normal. The appendix is visualized and
appears unremarkable. Multiple colonic diverticula noted without
acute inflammation.

Vascular/Lymphatic: Aortic atherosclerosis. No aneurysm. No
abdominopelvic adenopathy.

Reproductive: Prostate is unremarkable.

Other: No abdominal wall hernia or abnormality. No abdominopelvic
ascites.

Musculoskeletal: No acute or significant osseous findings. Lumbar
degenerative disc disease noted. This is most advanced at L5-S1.
IMPRESSION: 1. Distal right ureteral calculus measures 7 mm in diameter and
results in mild right hydroureter and pelviectasis.
2. Gallstones.
3. Coronary artery calcifications noted.
4. Aortic atherosclerosis.

Aortic Atherosclerosis (TCIT7-8SV.V).

## 2022-07-23 DIAGNOSIS — G629 Polyneuropathy, unspecified: Secondary | ICD-10-CM | POA: Diagnosis not present

## 2022-07-23 DIAGNOSIS — E1169 Type 2 diabetes mellitus with other specified complication: Secondary | ICD-10-CM | POA: Diagnosis not present

## 2022-07-23 DIAGNOSIS — I10 Essential (primary) hypertension: Secondary | ICD-10-CM | POA: Diagnosis not present

## 2022-08-10 DIAGNOSIS — D225 Melanocytic nevi of trunk: Secondary | ICD-10-CM | POA: Diagnosis not present

## 2022-08-10 DIAGNOSIS — L603 Nail dystrophy: Secondary | ICD-10-CM | POA: Diagnosis not present

## 2022-08-10 DIAGNOSIS — Z08 Encounter for follow-up examination after completed treatment for malignant neoplasm: Secondary | ICD-10-CM | POA: Diagnosis not present

## 2022-08-10 DIAGNOSIS — L570 Actinic keratosis: Secondary | ICD-10-CM | POA: Diagnosis not present

## 2022-09-27 ENCOUNTER — Ambulatory Visit (INDEPENDENT_AMBULATORY_CARE_PROVIDER_SITE_OTHER): Payer: BC Managed Care – PPO | Admitting: Neurology

## 2022-09-27 ENCOUNTER — Encounter: Payer: Self-pay | Admitting: Neurology

## 2022-09-27 VITALS — BP 148/84 | HR 64 | Ht 71.0 in | Wt 274.0 lb

## 2022-09-27 DIAGNOSIS — E1140 Type 2 diabetes mellitus with diabetic neuropathy, unspecified: Secondary | ICD-10-CM

## 2022-09-27 DIAGNOSIS — I209 Angina pectoris, unspecified: Secondary | ICD-10-CM

## 2022-09-27 DIAGNOSIS — G4733 Obstructive sleep apnea (adult) (pediatric): Secondary | ICD-10-CM

## 2022-09-27 DIAGNOSIS — R011 Cardiac murmur, unspecified: Secondary | ICD-10-CM
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
## 2022-09-27 NOTE — Patient Instructions (Signed)
DASH Eating Plan DASH stands for Dietary Approaches to Stop Hypertension. The DASH eating plan is a healthy eating plan that has been shown to: Reduce high blood pressure (hypertension). Reduce your risk for type 2 diabetes, heart disease, and stroke. Help with weight loss. What are tips for following this plan? Reading food labels Check food labels for the amount of salt (sodium) per serving. Choose foods with less than 5 percent of the Daily Value of sodium. Generally, foods with less than 300 milligrams (mg) of sodium per serving fit into this eating plan. To find whole grains, look for the word "whole" as the first word in the ingredient list. Shopping Buy products labeled as "low-sodium" or "no salt added." Buy fresh foods. Avoid canned foods and pre-made or frozen meals. Cooking Avoid adding salt when cooking. Use salt-free seasonings or herbs instead of table salt or sea salt. Check with your health care provider or pharmacist before using salt substitutes. Do not fry foods. Cook foods using healthy methods such as baking, boiling, grilling, roasting, and broiling instead. Cook with heart-healthy oils, such as olive, canola, avocado, soybean, or sunflower oil. Meal planning  Eat a balanced diet that includes: 4 or more servings of fruits and 4 or more servings of vegetables each day. Try to fill one-half of your plate with fruits and vegetables. 6-8 servings of whole grains each day. Less than 6 oz (170 g) of lean meat, poultry, or fish each day. A 3-oz (85-g) serving of meat is about the same size as a deck of cards. One egg equals 1 oz (28 g). 2-3 servings of low-fat dairy each day. One serving is 1 cup (237 mL). 1 serving of nuts, seeds, or beans 5 times each week. 2-3 servings of heart-healthy fats. Healthy fats called omega-3 fatty acids are found in foods such as walnuts, flaxseeds, fortified milks, and eggs. These fats are also found in cold-water fish, such as sardines, salmon,  and mackerel. Limit how much you eat of: Canned or prepackaged foods. Food that is high in trans fat, such as some fried foods. Food that is high in saturated fat, such as fatty meat. Desserts and other sweets, sugary drinks, and other foods with added sugar. Full-fat dairy products. Do not salt foods before eating. Do not eat more than 4 egg yolks a week. Try to eat at least 2 vegetarian meals a week. Eat more home-cooked food and less restaurant, buffet, and fast food. Lifestyle When eating at a restaurant, ask that your food be prepared with less salt or no salt, if possible. If you drink alcohol: Limit how much you use to: 0-1 drink a day for women who are not pregnant. 0-2 drinks a day for men. Be aware of how much alcohol is in your drink. In the [place] one drink equals one 12 oz bottle of beer (355 mL), one 5 oz glass of wine (148 mL), or one 1 oz glass of hard liquor (44 mL). General information Avoid eating more than 2,300 mg of salt a day. If you have hypertension, you may need to reduce your sodium intake to 1,500 mg a day. Work with your health care provider to maintain a healthy body weight or to lose weight. Ask what an ideal weight is for you. Get at least 30 minutes of exercise that causes your heart to beat faster (aerobic exercise) most days of the week. Activities may include walking, swimming, or biking. Work with your health care provider or dietitian to   adjust your eating plan to your individual calorie needs. What foods should I eat? Fruits All fresh, dried, or frozen fruit. Canned fruit in natural juice (without added sugar). Vegetables Fresh or frozen vegetables (raw, steamed, roasted, or grilled). Low-sodium or reduced-sodium tomato and vegetable juice. Low-sodium or reduced-sodium tomato sauce and tomato paste. Low-sodium or reduced-sodium canned vegetables. Grains Whole-grain or whole-wheat bread. Whole-grain or whole-wheat pasta. Brown rice. Oatmeal. Quinoa.  Bulgur. Whole-grain and low-sodium cereals. Pita bread. Low-fat, low-sodium crackers. Whole-wheat flour tortillas. Meats and other proteins Skinless chicken or turkey. Ground chicken or turkey. Pork with fat trimmed off. Fish and seafood. Egg whites. Dried beans, peas, or lentils. Unsalted nuts, nut butters, and seeds. Unsalted canned beans. Lean cuts of beef with fat trimmed off. Low-sodium, lean precooked or cured meat, such as sausages or meat loaves. Dairy Low-fat (1%) or fat-free (skim) milk. Reduced-fat, low-fat, or fat-free cheeses. Nonfat, low-sodium ricotta or cottage cheese. Low-fat or nonfat yogurt. Low-fat, low-sodium cheese. Fats and oils Soft margarine without trans fats. Vegetable oil. Reduced-fat, low-fat, or light mayonnaise and salad dressings (reduced-sodium). Canola, safflower, olive, avocado, soybean, and sunflower oils. Avocado. Seasonings and condiments Herbs. Spices. Seasoning mixes without salt. Other foods Unsalted popcorn and pretzels. Fat-free sweets. The items listed above may not be a complete list of foods and beverages you can eat. Contact a dietitian for more information. What foods should I avoid? Fruits Canned fruit in a light or heavy syrup. Fried fruit. Fruit in cream or butter sauce. Vegetables Creamed or fried vegetables. Vegetables in a cheese sauce. Regular canned vegetables (not low-sodium or reduced-sodium). Regular canned tomato sauce and paste (not low-sodium or reduced-sodium). Regular tomato and vegetable juice (not low-sodium or reduced-sodium). Pickles. Olives. Grains Baked goods made with fat, such as croissants, muffins, or some breads. Dry pasta or rice meal packs. Meats and other proteins Fatty cuts of meat. Ribs. Fried meat. Bacon. Bologna, salami, and other precooked or cured meats, such as sausages or meat loaves. Fat from the back of a pig (fatback). Bratwurst. Salted nuts and seeds. Canned beans with added salt. Canned or smoked fish.  Whole eggs or egg yolks. Chicken or turkey with skin. Dairy Whole or 2% milk, cream, and half-and-half. Whole or full-fat cream cheese. Whole-fat or sweetened yogurt. Full-fat cheese. Nondairy creamers. Whipped toppings. Processed cheese and cheese spreads. Fats and oils Butter. Stick margarine. Lard. Shortening. Ghee. Bacon fat. Tropical oils, such as coconut, palm kernel, or palm oil. Seasonings and condiments Onion salt, garlic salt, seasoned salt, table salt, and sea salt. Worcestershire sauce. Tartar sauce. Barbecue sauce. Teriyaki sauce. Soy sauce, including reduced-sodium. Steak sauce. Canned and packaged gravies. Fish sauce. Oyster sauce. Cocktail sauce. Store-bought horseradish. Ketchup. Mustard. Meat flavorings and tenderizers. Bouillon cubes. Hot sauces. Pre-made or packaged marinades. Pre-made or packaged taco seasonings. Relishes. Regular salad dressings. Other foods Salted popcorn and pretzels. The items listed above may not be a complete list of foods and beverages you should avoid. Contact a dietitian for more information. Where to find more information National Heart, Lung, and Blood Institute: www.nhlbi.nih.gov American Heart Association: www.heart.org Academy of Nutrition and Dietetics: www.eatright.org National Kidney Foundation: www.kidney.org Summary The DASH eating plan is a healthy eating plan that has been shown to reduce high blood pressure (hypertension). It may also reduce your risk for type 2 diabetes, heart disease, and stroke. When on the DASH eating plan, aim to eat more fresh fruits and vegetables, whole grains, lean proteins, low-fat dairy, and heart-healthy fats. With the DASH   eating plan, you should limit salt (sodium) intake to 2,300 mg a day. If you have hypertension, you may need to reduce your sodium intake to 1,500 mg a day. Work with your health care provider or dietitian to adjust your eating plan to your individual calorie needs. This information is not  intended to replace advice given to you by your health care provider. Make sure you discuss any questions you have with your health care provider. Document Revised: 05/15/2019 Document Reviewed: 05/15/2019 Elsevier Patient Education  [place].   CPAP and BIPAP Information CPAP and BIPAP are methods that use air pressure to keep your airways open and to help you breathe well. CPAP and BIPAP use different amounts of pressure. Your health care provider will tell you whether CPAP or BIPAP would be more helpful for you. CPAP stands for "continuous positive airway pressure." With CPAP, the amount of pressure stays the same while you breathe in (inhale) and out (exhale). BIPAP stands for "bi-level positive airway pressure." With BIPAP, the amount of pressure will be higher when you inhale and lower when you exhale. This allows you to take larger breaths. CPAP or BIPAP may be used in the hospital, or your health care provider may want you to use it at home. You may need to have a sleep study before your health care provider can order a machine for you to use at home. What are the advantages? CPAP or BIPAP can be helpful if you have: Sleep apnea. Chronic obstructive pulmonary disease (COPD). Heart failure. Medical conditions that cause muscle weakness, including muscular dystrophy or amyotrophic lateral sclerosis (ALS). Other problems that cause breathing to be shallow, weak, abnormal, or difficult. CPAP and BIPAP are most commonly used for obstructive sleep apnea (OSA) to keep the airways from collapsing when the muscles relax during sleep. What are the risks? Generally, this is a safe treatment. However, problems may occur, including: Irritated skin or skin sores if the mask does not fit properly. Dry or stuffy nose or nosebleeds. Dry mouth. Feeling gassy or bloated. Sinus or lung infection if the equipment is not cleaned properly. When should CPAP or BIPAP be used? In most cases, the  mask only needs to be worn during sleep. Generally, the mask needs to be worn throughout the night and during any daytime naps. People with certain medical conditions may also need to wear the mask at other times, such as when they are awake. Follow instructions from your health care provider about when to use the machine. What happens during CPAP or BIPAP?  Both CPAP and BIPAP are provided by a small machine with a flexible plastic tube that attaches to a plastic mask that you wear. Air is blown through the mask into your nose or mouth. The amount of pressure that is used to blow the air can be adjusted on the machine. Your health care provider will set the pressure setting and help you find the best mask for you. Tips for using the mask Because the mask needs to be snug, some people feel trapped or closed-in (claustrophobic) when first using the mask. If you feel this way, you may need to get used to the mask. One way to do this is to hold the mask loosely over your nose or mouth and then gradually apply the mask more snugly. You can also gradually increase the amount of time that you use the mask. Masks are available in various types and sizes. If your mask does not fit  well, talk with your health care provider about getting a different one. Some common types of masks include: Full face masks, which fit over the mouth and nose. Nasal masks, which fit over the nose. Nasal pillow or prong masks, which fit into the nostrils. If you are using a mask that fits over your nose and you tend to breathe through your mouth, a chin strap may be applied to help keep your mouth closed. Use a skin barrier to protect your skin as told by your health care provider. Some CPAP and BIPAP machines have alarms that may sound if the mask comes off or develops a leak. If you have trouble with the mask, it is very important that you talk with your health care provider about finding a way to make the mask easier to tolerate.  Do not stop using the mask. There could be a negative impact on your health if you stop using the mask. Tips for using the machine Place your CPAP or BIPAP machine on a secure table or stand near an electrical outlet. Know where the on/off switch is on the machine. Follow instructions from your health care provider about how to set the pressure on your machine and when you should use it. Do not eat or drink while the CPAP or BIPAP machine is on. Food or fluids could get pushed into your lungs by the pressure of the CPAP or BIPAP. For home use, CPAP and BIPAP machines can be rented or purchased through home health care companies. Many different brands of machines are available. Renting a machine before purchasing may help you find out which particular machine works well for you. Your health insurance company may also decide which machine you may get. Keep the CPAP or BIPAP machine and attachments clean. Ask your health care provider for specific instructions. Check the humidifier if you have a dry stuffy nose or nosebleeds. Make sure it is working correctly. Follow these instructions at home: Take over-the-counter and prescription medicines only as told by your health care provider. Ask if you can take sinus medicine if your sinuses are blocked. Do not use any products that contain nicotine or tobacco. These products include cigarettes, chewing tobacco, and vaping devices, such as e-cigarettes. If you need help quitting, ask your health care provider. Keep all follow-up visits. This is important. Contact a health care provider if: You have redness or pressure sores on your head, face, mouth, or nose from the mask or head gear. You have trouble using the CPAP or BIPAP machine. You cannot tolerate wearing the CPAP or BIPAP mask. Someone tells you that you snore even when wearing your CPAP or BIPAP. Get help right away if: You have trouble breathing. You feel confused. Summary CPAP and BIPAP are  methods that use air pressure to keep your airways open and to help you breathe well. If you have trouble with the mask, it is very important that you talk with your health care provider about finding a way to make the mask easier to tolerate. Do not stop using the mask. There could be a negative impact to your health if you stop using the mask. Follow instructions from your health care provider about when to use the machine. This information is not intended to replace advice given to you by your health care provider. Make sure you discuss any questions you have with your health care provider. Document Revised: 01/18/2021 Document Reviewed: 05/20/2020 Elsevier Patient Education  [place].

## 2022-12-10 DIAGNOSIS — E1169 Type 2 diabetes mellitus with other specified complication: Secondary | ICD-10-CM | POA: Diagnosis not present

## 2022-12-10 DIAGNOSIS — E291 Testicular hypofunction: Secondary | ICD-10-CM | POA: Diagnosis not present

## 2022-12-10 DIAGNOSIS — Z125 Encounter for screening for malignant neoplasm of prostate: Secondary | ICD-10-CM | POA: Diagnosis not present

## 2022-12-10 DIAGNOSIS — Z1331 Encounter for screening for depression: Secondary | ICD-10-CM | POA: Diagnosis not present

## 2022-12-10 DIAGNOSIS — Z0000 Encounter for general adult medical examination without abnormal findings: Secondary | ICD-10-CM | POA: Diagnosis not present

## 2022-12-10 DIAGNOSIS — Z8349 Family history of other endocrine, nutritional and metabolic diseases: Secondary | ICD-10-CM | POA: Diagnosis not present

## 2022-12-10 DIAGNOSIS — Z23 Encounter for immunization: Secondary | ICD-10-CM | POA: Diagnosis not present

## 2022-12-10 DIAGNOSIS — Z1339 Encounter for screening examination for other mental health and behavioral disorders: Secondary | ICD-10-CM | POA: Diagnosis not present

## 2022-12-19 ENCOUNTER — Encounter: Payer: Self-pay | Admitting: Gastroenterology

## 2023-01-29 ENCOUNTER — Ambulatory Visit (AMBULATORY_SURGERY_CENTER): Payer: Self-pay

## 2023-01-29 VITALS — Ht 71.0 in | Wt 230.0 lb

## 2023-01-29 DIAGNOSIS — Z800 Family history of malignant neoplasm of digestive organs: Secondary | ICD-10-CM

## 2023-01-29 MED ORDER — NA SULFATE-K SULFATE-MG SULF 17.5-3.13-1.6 GM/177ML PO SOLN: 1.0000 | Freq: Once | ORAL | 0 refills | Status: AC

## 2023-01-29 NOTE — Progress Notes (Signed)

## 2023-01-31 ENCOUNTER — Encounter: Payer: Self-pay | Admitting: Gastroenterology

## 2023-02-09 ENCOUNTER — Encounter: Payer: Self-pay | Admitting: Certified Registered Nurse Anesthetist

## 2023-02-14 NOTE — Progress Notes (Deleted)
[facility] [address] [place]. [place] [address] [phone]       OFFICE FOLLOW UP NOTE  Mr. [name] Date of Birth:  [birth year] Medical Record Number:  [id number]    Primary neurologist: Dr. Vickey Huger Reason for visit: CPAP follow-up    SUBJECTIVE:   CHIEF COMPLAINT:  No chief complaint on file.  Follow-up visit:  Prior visit: 09/27/2022 with Dr. Vickey Huger  Brief HPI:   Micheal Chavez is a 62 y.o. male who was initially evaluated by Dr. Vickey Huger in 04/2017 for known sleep apnea on CPAP therapy since 2012.  Due for new machine, current machine malfunction.  Received new machine in 2018.  Very compliant with CPAP therapy.  Had prolonged time in between follow-up visit with follow-up visit in 09/2022 (due to pandemic) and due for new machine.  Did not pursue repeat sleep study due to high cost, prescription provided to obtain new machine (patient requested to purchase outside of DME due to high cost through insurance)     Interval history:           ROS:   14 system review of systems performed and negative with exception of those listed in HPI  PMH:  Past Medical History:  Diagnosis Date   Anemia    Coronary artery disease    Status post drug-eluting stent to the RCA 06/29/10. --  cath 06/29/10: LAD 30%; Cfx 30-50%; EF 60-65%   DM type 2 (diabetes mellitus, type 2) (HCC)    Dyslipidemia    Glucose intolerance (impaired glucose tolerance)    Hemorrhoids    History of kidney stones    Hyperlipidemia    Hypertension    Obesity    Previous back surgery    Rupture of left distal biceps tendon    Sleep apnea    Uses CPAP nightly    PSH:  Past Surgical History:  Procedure Laterality Date   BACK SURGERY  , Vanessa Kick [id number] last done    ruptured disc lower back   CARDIAC CATHETERIZATION  06/29/2010   LAD 30%; Cfx 30-50%; EF 60-65%    CHONDROPLASTY Right 03/14/2015   Procedure: PATELLO-FEMORAL, MEDIAL FEMORAL CONDYLE CHONDROPLASTY  WITH MICROFRACTURE TECHNIQUE;  Surgeon: Jodi Geralds, MD;  Location: [facility];  Service: Orthopedics;  Laterality: Right;   CORONARY ANGIOPLASTY WITH STENT PLACEMENT  06/29/2010   Successful percutaneous transluminal coronary angioplasty  with placement of a drug-eluting stent in the proximal right coronary  artery   DISTAL BICEPS TENDON REPAIR Left 08/07/2019   Procedure: LEFT DISTAL BICEPS TENDON REPAIR;  Surgeon: Jodi Geralds, MD;  Location: [facility];  Service: Orthopedics;  Laterality: Left;   EXTRACORPOREAL SHOCK WAVE LITHOTRIPSY Right 04/18/2020   Procedure: EXTRACORPOREAL SHOCK WAVE LITHOTRIPSY (ESWL);  Surgeon: Jerilee Field, MD;  Location: [facility];  Service: Urology;  Laterality: Right;   EYE SURGERY Bilateral 2011,2013   cataract   HEMORRHOID SURGERY     Status post hemorrhoid banding 3 or 4 times   KNEE ARTHROSCOPY WITH EXCISION PLICA Right 03/14/2015   Procedure: EXCISION MEDIAL PLICA;  Surgeon: Jodi Geralds, MD;  Location: [facility];  Service: Orthopedics;  Laterality: Right;   KNEE ARTHROSCOPY WITH MEDIAL MENISECTOMY Right 03/14/2015   Procedure: RIGHT KNEE ARTHROSCOPY WITH PARTIAL MEDIAL MENISECTOMY;  Surgeon: Jodi Geralds, MD;  Location: [facility];  Service: Orthopedics;  Laterality: Right;   LEFT HEART CATH AND CORONARY ANGIOGRAPHY N/A 12/25/2016   Procedure: Left Heart  Cath and Coronary Angiography;  Surgeon: Corky Crafts, MD;  Location: [facility];  Service: Cardiovascular;  Laterality: N/A;   TRANSANAL HEMORRHOIDAL DEARTERIALIZATION N/A 05/05/2020   Procedure: TRANSANAL HEMORRHOIDAL DEARTERIALIZATION;  Surgeon: Romie Levee, MD;  Location: [facility];  Service: General;  Laterality: N/A;    Social History:  Social History   Socioeconomic History   Marital status: Married    Spouse name: Not on file   Number of children: Not on file   Years of education:  Not on file   Highest education level: Not on file  Occupational History   Not on file  Tobacco Use   Smoking status: Former    Current packs/day: 0.00    Average packs/day: 0.5 packs/day for 10.0 years (5.0 ttl pk-yrs)    Types: Cigarettes    Start date: 08/08/1992    Quit date: 08/08/2002    Years since quitting: 20.5   Smokeless tobacco: Never   Tobacco comments:    quit 2004  Vaping Use   Vaping status: Never Used  Substance and Sexual Activity   Alcohol use: Yes    Comment: occasional   Drug use: No   Sexual activity: Not on file  Other Topics Concern   Not on file  Social History Narrative   Not on file   Social Determinants of Health   Financial Resource Strain: Not on file  Food Insecurity: Not on file  Transportation Needs: Not on file  Physical Activity: Not on file  Stress: Not on file  Social Connections: Not on file  Intimate Partner Violence: Not on file    Family History:  Family History  Problem Relation Age of Onset   Heart attack Mother    Colon cancer Father 32   Heart attack Father    Esophageal cancer Neg Hx    Rectal cancer Neg Hx    Stomach cancer Neg Hx     Medications:   Current Outpatient Medications on File Prior to Visit  Medication Sig Dispense Refill   Ascorbic Acid (VITAMIN C) 1000 MG tablet Take 1,000 mg by mouth daily.     aspirin 81 MG chewable tablet Chew 1 tablet (81 mg total) by mouth before cath procedure. (Patient taking differently: Chew 81 mg by mouth daily.)     atorvastatin (LIPITOR) 80 MG tablet Take 1 tablet (80 mg total) by mouth daily. 30 tablet 6   chlorthalidone (HYGROTON) 25 MG tablet Take 1 tablet by mouth daily.     Coenzyme Q10 (CO Q 10 PO) Take 200 mg daily by mouth.      Cyanocobalamin (VITAMIN B-12 PO) Take 1 tablet by mouth daily.     doxazosin (CARDURA) 2 MG tablet Take 2 mg by mouth every evening.      irbesartan (AVAPRO) 300 MG tablet Take 300 mg by mouth every evening.      metFORMIN (GLUCOPHAGE)  1000 MG tablet Take 1,000 mg by mouth 2 (two) times daily with a meal.      nitroGLYCERIN (NITROSTAT) 0.4 MG SL tablet Place 1 tablet (0.4 mg total) under the tongue every 5 (five) minutes as needed for chest pain (MAX 3 TABLETS). 25 tablet 3   Zinc 50 MG TABS Take 1 tablet by mouth daily.     No current facility-administered medications on file prior to visit.    Allergies:  No Known Allergies    OBJECTIVE:  Physical Exam  There were no vitals filed for this visit.  There is no height or weight on file to calculate BMI. No results found.   General: well developed, well nourished, seated, in no evident distress Head: head normocephalic and atraumatic.   Neck: supple with no carotid or supraclavicular bruits Cardiovascular: regular rate and rhythm, no murmurs Musculoskeletal: no deformity Skin:  no rash/petichiae Vascular:  Normal pulses all extremities   Neurologic Exam Mental Status: Awake and fully alert. Oriented to place and time. Recent and remote memory intact. Attention span, concentration and fund of knowledge appropriate. Mood and affect appropriate.  Cranial Nerves: Pupils equal, briskly reactive to light. Extraocular movements full without nystagmus. Visual fields full to confrontation. Hearing intact. Facial sensation intact. Face, tongue, palate moves normally and symmetrically.  Motor: Normal bulk and tone. Normal strength in all tested extremity muscles Sensory.: intact to touch , pinprick , position and vibratory sensation.  Coordination: Rapid alternating movements normal in all extremities. Finger-to-nose and heel-to-shin performed accurately bilaterally. Gait and Station: Arises from chair without difficulty. Stance is normal. Gait demonstrates normal stride length and balance without use of AD. Tandem walk and heel toe without difficulty.  Reflexes: 1+ and symmetric. Toes downgoing.         ASSESSMENT/PLAN: Micheal Chavez is a 62 y.o. year old  male    OSA on CPAP : Compliance report shows satisfactory usage with optimal residual AHI.  Discussed continued nightly usage with ensuring greater than 4 hours nightly for optimal benefit and per insurance purposes.  Continue to follow with DME company for any needed supplies or CPAP related concerns     Follow up in *** or call earlier if needed   CC:  PCP: Rodrigo Ran, MD    I spent *** minutes of face-to-face and non-face-to-face time with patient.  This included previsit chart review, lab review, study review, order entry, electronic health record documentation, patient education regarding diagnosis of sleep apnea with review and discussion of compliance report and answered all other questions to patient's satisfaction   Ihor Austin, [facility]  [facility] [address] [place], [place] [address]  Phone [phone] Fax [phone] Note: This document was prepared with digital dictation and possible smart phrase technology. Any transcriptional errors that result from this process are unintentional.

## 2023-02-15 ENCOUNTER — Telehealth: Payer: Self-pay | Admitting: Neurology

## 2023-02-15 NOTE — Telephone Encounter (Signed)
..   Pt understands that although there may be some limitations with this type of visit, we will take all precautions to reduce any security or privacy concerns.  Pt understands that this will be treated like an in office visit and we will file with pt's insurance, and there may be a patient responsible charge related to this service. ? ?

## 2023-02-18 ENCOUNTER — Ambulatory Visit: Payer: BC Managed Care – PPO | Admitting: Adult Health

## 2023-02-18 ENCOUNTER — Ambulatory Visit (AMBULATORY_SURGERY_CENTER): Payer: BC Managed Care – PPO | Admitting: Gastroenterology

## 2023-02-18 ENCOUNTER — Encounter: Payer: Self-pay | Admitting: Gastroenterology

## 2023-02-18 VITALS — BP 153/89 | HR 55 | Temp 86.0°F | Resp 12 | Ht 71.0 in | Wt 230.0 lb

## 2023-02-18 DIAGNOSIS — Z1211 Encounter for screening for malignant neoplasm of colon: Secondary | ICD-10-CM | POA: Diagnosis not present

## 2023-02-18 DIAGNOSIS — D125 Benign neoplasm of sigmoid colon: Secondary | ICD-10-CM

## 2023-02-18 DIAGNOSIS — Z800 Family history of malignant neoplasm of digestive organs: Secondary | ICD-10-CM

## 2023-02-18 DIAGNOSIS — D120 Benign neoplasm of cecum: Secondary | ICD-10-CM

## 2023-02-18 DIAGNOSIS — K635 Polyp of colon: Secondary | ICD-10-CM | POA: Diagnosis not present

## 2023-02-18 DIAGNOSIS — D122 Benign neoplasm of ascending colon: Secondary | ICD-10-CM

## 2023-02-18 DIAGNOSIS — K621 Rectal polyp: Secondary | ICD-10-CM | POA: Diagnosis not present

## 2023-02-18 MED ORDER — SODIUM CHLORIDE 0.9 % IV SOLN: 500.0000 mL | Freq: Once | INTRAVENOUS | Status: DC

## 2023-02-18 NOTE — Op Note (Signed)
[facility] Patient Name: Micheal Chavez Procedure Date: 02/18/2023 10:54 AM MRN: [id number] Endoscopist: Viviann Spare P. Adela Lank , MD, [id number] Age: 62 Referring MD:  Date of Birth: 1961-03-05 Gender: Male Account #: [id number] Procedure:                Colonoscopy Indications:              Screening in patient at increased risk: Family                            history of 1st-degree relative with colorectal                            cancer (father dx age 26s). Medicines:                Monitored Anesthesia Care Procedure:                Pre-Anesthesia Assessment:                           - Prior to the procedure, a History and Physical                            was performed, and patient medications and                            allergies were reviewed. The patient's tolerance of                            previous anesthesia was also reviewed. The risks                            and benefits of the procedure and the sedation                            options and risks were discussed with the patient.                            All questions were answered, and informed consent                            was obtained. Prior Anticoagulants: The patient has                            taken no anticoagulant or antiplatelet agents. ASA                            Grade Assessment: III - A patient with severe                            systemic disease. After reviewing the risks and                            benefits, the patient was deemed in satisfactory  condition to undergo the procedure.                           After obtaining informed consent, the colonoscope                            was passed under direct vision. Throughout the                            procedure, the patient's blood pressure, pulse, and                            oxygen saturations were monitored continuously. The                            CF [id number] #[id number] was  introduced through the anus                            and advanced to the the cecum, identified by                            appendiceal orifice and ileocecal valve. The                            colonoscopy was performed without difficulty. The                            patient tolerated the procedure well. The quality                            of the bowel preparation was good. The ileocecal                            valve, appendiceal orifice, and rectum were                            photographed. Scope In: 11:07:38 AM Scope Out: 11:29:44 AM Scope Withdrawal Time: 0 hours 17 minutes 48 seconds  Total Procedure Duration: 0 hours 22 minutes 6 seconds  Findings:                 Skin tags were found on perianal exam.                           A diminutive polyp was found in the cecum. The                            polyp was sessile. The polyp was removed with a                            cold snare. Resection and retrieval were complete.                           A 4 mm polyp was found in the ascending colon. The  polyp was sessile. The polyp was removed with a                            cold snare. Resection and retrieval were complete.                           A 4 mm polyp was found in the sigmoid colon. The                            polyp was sessile. The polyp was removed with a                            cold snare. Resection and retrieval were complete.                           A diminutive polyp was found in the distal rectum.                            The polyp was sessile. The polyp was removed with a                            cold snare. Resection and retrieval were complete.                           Many medium-mouthed diverticula were found in the                            sigmoid colon.                           Internal hemorrhoids were found during retroflexion                            with stigmata of prior banding / surgery  (extensive                            scarring)                           The exam was otherwise without abnormality. Complications:            No immediate complications. Estimated blood loss:                            Minimal. Estimated Blood Loss:     Estimated blood loss was minimal. Impression:               - Perianal skin tags found on perianal exam.                           - One diminutive polyp in the cecum, removed with a                            cold snare. Resected and retrieved.                           -  One 4 mm polyp in the ascending colon, removed                            with a cold snare. Resected and retrieved.                           - One 4 mm polyp in the sigmoid colon, removed with                            a cold snare. Resected and retrieved.                           - One diminutive polyp in the distal rectum,                            removed with a cold snare. Resected and retrieved.                           - Diverticulosis in the sigmoid colon.                           - Internal hemorrhoids with extensive scarring from                            prior therapy.                           - The examination was otherwise normal. Recommendation:           - Patient has a contact number available for                            emergencies. The signs and symptoms of potential                            delayed complications were discussed with the                            patient. Return to normal activities tomorrow.                            Written discharge instructions were provided to the                            patient.                           - Resume previous diet.                           - Continue present medications.                           - Await pathology results. Viviann Spare P. Latissa Frick, MD 02/18/2023 11:34:58 AM This report has been signed electronically.

## 2023-02-18 NOTE — Progress Notes (Signed)
Report given to PACU, vss 

## 2023-02-18 NOTE — Patient Instructions (Signed)
Discharge instructions given. Handouts on polyps,diverticulosis and Hemorrhoids. Resume previous medications. YOU HAD AN ENDOSCOPIC PROCEDURE TODAY AT THE [facility]:   Refer to the procedure report that was given to you for any specific questions about what was found during the examination.  If the procedure report does not answer your questions, please call your gastroenterologist to clarify.  If you requested that your care partner not be given the details of your procedure findings, then the procedure report has been included in a sealed envelope for you to review at your convenience later.  YOU SHOULD EXPECT: Some feelings of bloating in the abdomen. Passage of more gas than usual.  Walking can help get rid of the air that was put into your GI tract during the procedure and reduce the bloating. If you had a lower endoscopy (such as a colonoscopy or flexible sigmoidoscopy) you may notice spotting of blood in your stool or on the toilet paper. If you underwent a bowel prep for your procedure, you may not have a normal bowel movement for a few days.  Please Note:  You might notice some irritation and congestion in your nose or some drainage.  This is from the oxygen used during your procedure.  There is no need for concern and it should clear up in a day or so.  SYMPTOMS TO REPORT IMMEDIATELY:  Following lower endoscopy (colonoscopy or flexible sigmoidoscopy):  Excessive amounts of blood in the stool  Significant tenderness or worsening of abdominal pains  Swelling of the abdomen that is new, acute  Fever of 100F or higher   Black, tarry-looking stools  For urgent or emergent issues, a gastroenterologist can be reached at any hour by calling [phone]. Do not use MyChart messaging for urgent concerns.    DIET:  We do recommend a small meal at first, but then you may proceed to your regular diet.  Drink plenty of fluids but you should avoid alcoholic beverages for 24  hours.  ACTIVITY:  You should plan to take it easy for the rest of today and you should NOT DRIVE or use heavy machinery until tomorrow (because of the sedation medicines used during the test).    FOLLOW UP: Our staff will call the number listed on your records the next business day following your procedure.  We will call around 7:15- 8:00 am to check on you and address any questions or concerns that you may have regarding the information given to you following your procedure. If we do not reach you, we will leave a message.     If any biopsies were taken you will be contacted by phone or by letter within the next 1-3 weeks.  Please call us at [phone] if you have not heard about the biopsies in 3 weeks.    SIGNATURES/CONFIDENTIALITY: You and/or your care partner have signed paperwork which will be entered into your electronic medical record.  These signatures attest to the fact that that the information above on your After Visit Summary has been reviewed and is understood.  Full responsibility of the confidentiality of this discharge information lies with you and/or your care-partner.

## 2023-02-18 NOTE — Progress Notes (Signed)
[facility] History and Physical   Primary Care Physician:  Rodrigo Ran, MD   Reason for Procedure:   Family history of colon cancer  Plan:    colonoscopy     HPI: Micheal Chavez is a 62 y.o. male  here for colonoscopy screening - father had colon cancer, dx age 37s. Last exam 01/2017.   Patient denies any bowel symptoms at this time.  Otherwise feels well without any cardiopulmonary symptoms.   I have discussed risks / benefits of anesthesia and endoscopic procedure with Wilma Flavin and they wish to proceed with the exams as outlined today.    Past Medical History:  Diagnosis Date   Anemia    Coronary artery disease    Status post drug-eluting stent to the RCA 06/29/10. --  cath 06/29/10: LAD 30%; Cfx 30-50%; EF 60-65%   DM type 2 (diabetes mellitus, type 2) (HCC)    Dyslipidemia    Glucose intolerance (impaired glucose tolerance)    Hemorrhoids    History of kidney stones    Hyperlipidemia    Hypertension    Obesity    Previous back surgery    Rupture of left distal biceps tendon    Sleep apnea    Uses CPAP nightly    Past Surgical History:  Procedure Laterality Date   BACK SURGERY  , Vanessa Kick [id number] last done    ruptured disc lower back   CARDIAC CATHETERIZATION  06/29/2010   LAD 30%; Cfx 30-50%; EF 60-65%    CHONDROPLASTY Right 03/14/2015   Procedure: PATELLO-FEMORAL, MEDIAL FEMORAL CONDYLE CHONDROPLASTY WITH MICROFRACTURE TECHNIQUE;  Surgeon: Jodi Geralds, MD;  Location: [facility];  Service: Orthopedics;  Laterality: Right;   CORONARY ANGIOPLASTY WITH STENT PLACEMENT  06/29/2010   Successful percutaneous transluminal coronary angioplasty  with placement of a drug-eluting stent in the proximal right coronary  artery   DISTAL BICEPS TENDON REPAIR Left 08/07/2019   Procedure: LEFT DISTAL BICEPS TENDON REPAIR;  Surgeon: Jodi Geralds, MD;  Location: [facility];  Service: Orthopedics;  Laterality: Left;   EXTRACORPOREAL SHOCK  WAVE LITHOTRIPSY Right 04/18/2020   Procedure: EXTRACORPOREAL SHOCK WAVE LITHOTRIPSY (ESWL);  Surgeon: Jerilee Field, MD;  Location: [facility];  Service: Urology;  Laterality: Right;   EYE SURGERY Bilateral 2011,2013   cataract   HEMORRHOID SURGERY     Status post hemorrhoid banding 3 or 4 times   KNEE ARTHROSCOPY WITH EXCISION PLICA Right 03/14/2015   Procedure: EXCISION MEDIAL PLICA;  Surgeon: Jodi Geralds, MD;  Location: [facility];  Service: Orthopedics;  Laterality: Right;   KNEE ARTHROSCOPY WITH MEDIAL MENISECTOMY Right 03/14/2015   Procedure: RIGHT KNEE ARTHROSCOPY WITH PARTIAL MEDIAL MENISECTOMY;  Surgeon: Jodi Geralds, MD;  Location: [facility];  Service: Orthopedics;  Laterality: Right;   LEFT HEART CATH AND CORONARY ANGIOGRAPHY N/A 12/25/2016   Procedure: Left Heart Cath and Coronary Angiography;  Surgeon: Corky Crafts, MD;  Location: [facility] INVASIVE CV LAB;  Service: Cardiovascular;  Laterality: N/A;   TRANSANAL HEMORRHOIDAL DEARTERIALIZATION N/A 05/05/2020   Procedure: TRANSANAL HEMORRHOIDAL DEARTERIALIZATION;  Surgeon: Romie Levee, MD;  Location: [facility] [place];  Service: General;  Laterality: N/A;    Prior to Admission medications   Medication Sig Start Date End Date Taking? Authorizing Provider  Ascorbic Acid (VITAMIN C) 1000 MG tablet Take 1,000 mg by mouth daily.    [provider]  aspirin 81 MG chewable tablet Chew 1 tablet (81 mg total) by  mouth before cath procedure. Patient taking differently: Chew 81 mg by mouth daily. 12/26/16   Corky Crafts, MD  atorvastatin (LIPITOR) 80 MG tablet Take 1 tablet (80 mg total) by mouth daily. 07/20/11   Wendall Stade, MD  chlorthalidone (HYGROTON) 25 MG tablet Take 1 tablet by mouth daily. 12/11/16   [provider]  Coenzyme Q10 (CO Q 10 PO) Take 200 mg daily by mouth.     [provider]  Cyanocobalamin (VITAMIN B-12 PO) Take 1 tablet by  mouth daily.    [provider]  doxazosin (CARDURA) 2 MG tablet Take 2 mg by mouth every evening.     [provider]  irbesartan (AVAPRO) 300 MG tablet Take 300 mg by mouth every evening.     [provider]  metFORMIN (GLUCOPHAGE) 1000 MG tablet Take 1,000 mg by mouth 2 (two) times daily with a meal.     [provider]  nitroGLYCERIN (NITROSTAT) 0.4 MG SL tablet Place 1 tablet (0.4 mg total) under the tongue every 5 (five) minutes as needed for chest pain (MAX 3 TABLETS). 04/21/18   Wendall Stade, MD  Zinc 50 MG TABS Take 1 tablet by mouth daily.    [provider]    Current Outpatient Medications  Medication Sig Dispense Refill   Ascorbic Acid (VITAMIN C) 1000 MG tablet Take 1,000 mg by mouth daily.     aspirin 81 MG chewable tablet Chew 1 tablet (81 mg total) by mouth before cath procedure. (Patient taking differently: Chew 81 mg by mouth daily.)     atorvastatin (LIPITOR) 80 MG tablet Take 1 tablet (80 mg total) by mouth daily. 30 tablet 6   chlorthalidone (HYGROTON) 25 MG tablet Take 1 tablet by mouth daily.     Coenzyme Q10 (CO Q 10 PO) Take 200 mg daily by mouth.      Cyanocobalamin (VITAMIN B-12 PO) Take 1 tablet by mouth daily.     doxazosin (CARDURA) 2 MG tablet Take 2 mg by mouth every evening.      irbesartan (AVAPRO) 300 MG tablet Take 300 mg by mouth every evening.      metFORMIN (GLUCOPHAGE) 1000 MG tablet Take 1,000 mg by mouth 2 (two) times daily with a meal.      nitroGLYCERIN (NITROSTAT) 0.4 MG SL tablet Place 1 tablet (0.4 mg total) under the tongue every 5 (five) minutes as needed for chest pain (MAX 3 TABLETS). 25 tablet 3   Zinc 50 MG TABS Take 1 tablet by mouth daily.     No current facility-administered medications for this visit.    Allergies as of 02/18/2023   (No Known Allergies)    Family History  Problem Relation Age of Onset   Heart attack Mother    Colon cancer Father 23   Heart attack Father     Esophageal cancer Neg Hx    Rectal cancer Neg Hx    Stomach cancer Neg Hx     Social History   Socioeconomic History   Marital status: Married    Spouse name: Not on file   Number of children: Not on file   Years of education: Not on file   Highest education level: Not on file  Occupational History   Not on file  Tobacco Use   Smoking status: Former    Current packs/day: 0.00    Average packs/day: 0.5 packs/day for 10.0 years (5.0 ttl pk-yrs)    Types: Cigarettes  Start date: 08/08/1992    Quit date: 08/08/2002    Years since quitting: 20.5   Smokeless tobacco: Never   Tobacco comments:    quit 2004  Vaping Use   Vaping status: Never Used  Substance and Sexual Activity   Alcohol use: Yes    Comment: occasional   Drug use: No   Sexual activity: Not on file  Other Topics Concern   Not on file  Social History Narrative   Not on file   Social Determinants of Health   Financial Resource Strain: Not on file  Food Insecurity: Not on file  Transportation Needs: Not on file  Physical Activity: Not on file  Stress: Not on file  Social Connections: Not on file  Intimate Partner Violence: Not on file    Review of Systems: All other review of systems negative except as mentioned in the HPI.  Physical Exam: Vital signs BP (!) 145/81   Pulse 63   Temp (!) 86 F (30 C)   Ht 5\' 11"  (1.803 m)   Wt 230 lb (104.3 kg)   SpO2 98%   BMI 32.08 kg/m   General:   Alert,  Well-developed, pleasant and cooperative in NAD Lungs:  Clear throughout to auscultation.   Heart:  Regular rate and rhythm Abdomen:  Soft, nontender and nondistended.   Neuro/Psych:  Alert and cooperative. Normal mood and affect. A and O x 3  Harlin Rain, MD [facility]

## 2023-02-19 ENCOUNTER — Telehealth: Payer: Self-pay

## 2023-02-19 NOTE — Telephone Encounter (Signed)
Follow up call to pt, lm for pt to call if having any difficulty with normal activities or eating and drinking.  Also to call if any other questions or concerns.  

## 2023-06-14 DIAGNOSIS — E1169 Type 2 diabetes mellitus with other specified complication: Secondary | ICD-10-CM | POA: Diagnosis not present

## 2023-06-14 DIAGNOSIS — I119 Hypertensive heart disease without heart failure: Secondary | ICD-10-CM | POA: Diagnosis not present

## 2023-06-14 DIAGNOSIS — E785 Hyperlipidemia, unspecified: Secondary | ICD-10-CM | POA: Diagnosis not present

## 2023-07-17 ENCOUNTER — Telehealth: Payer: BC Managed Care – PPO | Admitting: Adult Health

## 2023-08-08 DIAGNOSIS — D485 Neoplasm of uncertain behavior of skin: Secondary | ICD-10-CM | POA: Diagnosis not present

## 2023-08-08 DIAGNOSIS — L720 Epidermal cyst: Secondary | ICD-10-CM | POA: Diagnosis not present

## 2023-08-08 DIAGNOSIS — L570 Actinic keratosis: Secondary | ICD-10-CM | POA: Diagnosis not present

## 2023-08-08 DIAGNOSIS — Z08 Encounter for follow-up examination after completed treatment for malignant neoplasm: Secondary | ICD-10-CM | POA: Diagnosis not present

## 2023-08-08 DIAGNOSIS — D225 Melanocytic nevi of trunk: Secondary | ICD-10-CM | POA: Diagnosis not present

## 2023-08-08 DIAGNOSIS — Z85828 Personal history of other malignant neoplasm of skin: Secondary | ICD-10-CM | POA: Diagnosis not present

## 2023-08-15 ENCOUNTER — Telehealth: Payer: Self-pay | Admitting: *Deleted

## 2023-08-15 NOTE — Telephone Encounter (Signed)
 Spoke with patient and asked him to bring cpap machine along with power cord to visit. Pt verbalized he understood.

## 2023-08-19 ENCOUNTER — Ambulatory Visit: Payer: BC Managed Care – PPO | Admitting: Neurology

## 2023-08-19 ENCOUNTER — Encounter: Payer: Self-pay | Admitting: Neurology

## 2023-08-19 VITALS — BP 136/80 | HR 70 | Ht 71.0 in | Wt 221.0 lb

## 2023-08-19 DIAGNOSIS — G4733 Obstructive sleep apnea (adult) (pediatric): Secondary | ICD-10-CM

## 2023-08-19 DIAGNOSIS — I2510 Atherosclerotic heart disease of native coronary artery without angina pectoris: Secondary | ICD-10-CM

## 2023-08-19 NOTE — Progress Notes (Signed)
 Micheal Chavez
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
## 2023-08-19 NOTE — Patient Instructions (Signed)
 63 y.o. year- old male  here with:   Weight loss to BMI of 30-31.   CPAP dependency , diabetic neuropathy.     1) 100% CPAP compliant with excellent resolution of apnea. We decided to stay in CPAP, and he will postpone future testing  until Medicare age is reached.    RV in 18 months on the eve of his 64th B day.

## 2023-08-23 DIAGNOSIS — J40 Bronchitis, not specified as acute or chronic: Secondary | ICD-10-CM | POA: Diagnosis not present

## 2023-08-23 DIAGNOSIS — I10 Essential (primary) hypertension: Secondary | ICD-10-CM | POA: Diagnosis not present

## 2023-08-23 DIAGNOSIS — J069 Acute upper respiratory infection, unspecified: Secondary | ICD-10-CM | POA: Diagnosis not present

## 2023-10-16 ENCOUNTER — Other Ambulatory Visit (HOSPITAL_BASED_OUTPATIENT_CLINIC_OR_DEPARTMENT_OTHER): Payer: Self-pay | Admitting: Internal Medicine

## 2023-10-16 ENCOUNTER — Ambulatory Visit (HOSPITAL_BASED_OUTPATIENT_CLINIC_OR_DEPARTMENT_OTHER)
Admission: RE | Admit: 2023-10-16 | Discharge: 2023-10-16 | Disposition: A | Discharge status: Home / Self Care | Source: Ambulatory Visit | Attending: Internal Medicine | Admitting: Internal Medicine

## 2023-10-16 DIAGNOSIS — R600 Localized edema: Secondary | ICD-10-CM

## 2024-01-30 DIAGNOSIS — J019 Acute sinusitis, unspecified: Secondary | ICD-10-CM | POA: Diagnosis not present

## 2024-01-30 DIAGNOSIS — R051 Acute cough: Secondary | ICD-10-CM | POA: Diagnosis not present

## 2024-01-30 DIAGNOSIS — J029 Acute pharyngitis, unspecified: Secondary | ICD-10-CM | POA: Diagnosis not present

## 2024-01-30 DIAGNOSIS — E1169 Type 2 diabetes mellitus with other specified complication: Secondary | ICD-10-CM | POA: Diagnosis not present

## 2024-02-05 DIAGNOSIS — D485 Neoplasm of uncertain behavior of skin: Secondary | ICD-10-CM | POA: Diagnosis not present

## 2024-02-05 DIAGNOSIS — Z08 Encounter for follow-up examination after completed treatment for malignant neoplasm: Secondary | ICD-10-CM | POA: Diagnosis not present

## 2024-02-05 DIAGNOSIS — Z7189 Other specified counseling: Secondary | ICD-10-CM | POA: Diagnosis not present

## 2024-02-05 DIAGNOSIS — Z85828 Personal history of other malignant neoplasm of skin: Secondary | ICD-10-CM | POA: Diagnosis not present

## 2024-02-05 DIAGNOSIS — L570 Actinic keratosis: Secondary | ICD-10-CM | POA: Diagnosis not present

## 2024-02-05 DIAGNOSIS — D225 Melanocytic nevi of trunk: Secondary | ICD-10-CM | POA: Diagnosis not present

## 2024-03-30 DIAGNOSIS — Z1212 Encounter for screening for malignant neoplasm of rectum: Secondary | ICD-10-CM | POA: Diagnosis not present

## 2024-03-30 DIAGNOSIS — Z23 Encounter for immunization: Secondary | ICD-10-CM | POA: Diagnosis not present

## 2024-03-30 DIAGNOSIS — I119 Hypertensive heart disease without heart failure: Secondary | ICD-10-CM | POA: Diagnosis not present

## 2024-03-30 DIAGNOSIS — R82998 Other abnormal findings in urine: Secondary | ICD-10-CM | POA: Diagnosis not present

## 2024-03-30 DIAGNOSIS — Z1331 Encounter for screening for depression: Secondary | ICD-10-CM | POA: Diagnosis not present

## 2024-03-30 DIAGNOSIS — Z125 Encounter for screening for malignant neoplasm of prostate: Secondary | ICD-10-CM | POA: Diagnosis not present

## 2024-03-30 DIAGNOSIS — D649 Anemia, unspecified: Secondary | ICD-10-CM | POA: Diagnosis not present

## 2024-03-30 DIAGNOSIS — Z0000 Encounter for general adult medical examination without abnormal findings: Secondary | ICD-10-CM | POA: Diagnosis not present

## 2024-03-30 DIAGNOSIS — E1169 Type 2 diabetes mellitus with other specified complication: Secondary | ICD-10-CM | POA: Diagnosis not present

## 2024-03-30 DIAGNOSIS — E785 Hyperlipidemia, unspecified: Secondary | ICD-10-CM | POA: Diagnosis not present

## 2024-03-30 DIAGNOSIS — E291 Testicular hypofunction: Secondary | ICD-10-CM | POA: Diagnosis not present

## 2024-04-21 DIAGNOSIS — K4090 Unilateral inguinal hernia, without obstruction or gangrene, not specified as recurrent: Secondary | ICD-10-CM | POA: Diagnosis not present

## 2025-02-15 ENCOUNTER — Ambulatory Visit: Payer: BC Managed Care – PPO | Admitting: Adult Health
